# Patient Record
Sex: Male | Born: 1975 | Race: White | Hispanic: No | Marital: Married | Smoking: Former smoker
Health system: Southern US, Community
[De-identification: ages and names within clinical notes are randomized; demographics above are authoritative.]

## PROBLEM LIST (undated history)

## (undated) ENCOUNTER — Ambulatory Visit: Admission: EM | Payer: Commercial Managed Care - PPO

## (undated) DIAGNOSIS — K219 Gastro-esophageal reflux disease without esophagitis: Secondary | ICD-10-CM

## (undated) DIAGNOSIS — E785 Hyperlipidemia, unspecified: Secondary | ICD-10-CM

## (undated) HISTORY — DX: Gastro-esophageal reflux disease without esophagitis: K21.9

## (undated) HISTORY — DX: Hyperlipidemia, unspecified: E78.5

---

## 2000-08-26 ENCOUNTER — Other Ambulatory Visit: Admission: RE | Admit: 2000-08-26 | Discharge: 2000-08-26 | Payer: Self-pay | Admitting: Urology

## 2002-03-07 ENCOUNTER — Encounter: Admission: RE | Admit: 2002-03-07 | Discharge: 2002-03-07 | Payer: Self-pay | Admitting: Occupational Medicine

## 2002-03-07 ENCOUNTER — Encounter: Payer: Self-pay | Admitting: Occupational Medicine

## 2003-05-07 ENCOUNTER — Ambulatory Visit (HOSPITAL_COMMUNITY): Admission: RE | Admit: 2003-05-07 | Discharge: 2003-05-07 | Payer: Self-pay | Admitting: Family Medicine

## 2004-10-19 ENCOUNTER — Encounter: Admission: RE | Admit: 2004-10-19 | Discharge: 2004-10-19 | Payer: Self-pay | Admitting: Occupational Medicine

## 2008-05-13 ENCOUNTER — Ambulatory Visit (HOSPITAL_COMMUNITY): Admission: RE | Admit: 2008-05-13 | Discharge: 2008-05-13 | Payer: Self-pay | Admitting: Family Medicine

## 2009-05-15 ENCOUNTER — Ambulatory Visit (HOSPITAL_COMMUNITY): Admission: RE | Admit: 2009-05-15 | Discharge: 2009-05-15 | Payer: Self-pay | Admitting: Family Medicine

## 2009-12-09 HISTORY — PX: ESOPHAGOGASTRODUODENOSCOPY: SHX1529

## 2009-12-23 ENCOUNTER — Ambulatory Visit: Payer: Self-pay | Admitting: Internal Medicine

## 2009-12-23 DIAGNOSIS — R0789 Other chest pain: Secondary | ICD-10-CM | POA: Insufficient documentation

## 2009-12-29 ENCOUNTER — Ambulatory Visit: Payer: Self-pay | Admitting: Internal Medicine

## 2009-12-29 ENCOUNTER — Ambulatory Visit (HOSPITAL_COMMUNITY): Admission: RE | Admit: 2009-12-29 | Discharge: 2009-12-29 | Payer: Self-pay | Admitting: Internal Medicine

## 2009-12-31 ENCOUNTER — Encounter: Payer: Self-pay | Admitting: Internal Medicine

## 2010-01-27 ENCOUNTER — Ambulatory Visit: Payer: Self-pay | Admitting: Internal Medicine

## 2010-01-27 DIAGNOSIS — K219 Gastro-esophageal reflux disease without esophagitis: Secondary | ICD-10-CM

## 2010-02-23 ENCOUNTER — Encounter: Payer: Self-pay | Admitting: Internal Medicine

## 2010-03-01 ENCOUNTER — Encounter: Payer: Self-pay | Admitting: Family Medicine

## 2010-03-10 NOTE — Letter (Signed)
Summary: REFERRAL FROM [facility]  REFERRAL FROM [facility]   Imported By: Rexene Alberts 12/23/2009 15:23:58  _____________________________________________________________________  External Attachment:    Type:   Image     Comment:   External Document

## 2010-03-10 NOTE — Letter (Signed)
Summary: Patient Notice, Endo Biopsy Results  [facility]  [address]   [place], [place] [address]   Phone: [phone]  Fax: [phone]       December 31, 2009   Shawn Moon [address] [place], [place]  [address] 10/03/1975    Dear [name],  I am pleased to inform you that the biopsies taken during your recent endoscopic examination did not show any evidence of cancer or other abnormality upon pathologic examination.  Additional information/recommendations:  Continue with the treatment plan as outlined on the day of your exam.  Please call us if you are having persistent problems or have questions about your condition that have not been fully answered at this time.  Sincerely,    R. Roetta Sessions MD, FACP [facility] Ph: [phone]   Fax: [phone]   Appended Document: Patient Notice, Endo Biopsy Results letter mailed to pt

## 2010-03-12 NOTE — Assessment & Plan Note (Signed)
Summary: f/u EGD, GERD/MM   Visit Type:  Initial Consult Referring Nitin Mckowen:  [facility] Primary [facility] Vernel Langenderfer:  [facility]  CC:  Genella Rife.  History of Present Illness: Mr. Shawn Moon presents in f/u after EGD done December 29, 2009 secondary to globus sensation and pressure in anterior chest. EGD showed normal esophagus with couple of tiny erosions, started on trial of Protonix daily. Pt reports that pain has resolved, occasionally has reflux at night, works till 7, then eats around 7, in bed by nine. occurs 3 out of 5 nights. avoiding spicy foods.Denies N/V. No dysphagia/odynophagia. Feels better overall, but still having occasional reflux symptoms as he eats shortly before going to bed.   Current Medications (verified): 1)  Protonix 40 Mg Tbec (Pantoprazole Sodium) .... Take 1 Tablet By Mouth Once A Day  Allergies: 1)  ! Pcn 2)  ! Biaxin  Past History:  Past Surgical History: Last updated: 12/23/2009 Unremarkable  Past Medical History: Unremarkable 12/2009: EGD: Normal esophagus, status post softer biopsy. 2. Couple of tiny antral erosions of doubtful clinical significance,     otherwise normal stomach, D1 and D2.  Review of Systems General:  Denies fever, chills, and anorexia. Eyes:  Denies blurring, irritation, and discharge. ENT:  Denies sore throat, hoarseness, and difficulty swallowing. CV:  Denies chest pains and syncope. Resp:  Denies dyspnea at rest and wheezing. GI:  Denies difficulty swallowing, pain on swallowing, and nausea; occasional nocturnal reflux. GU:  Denies urinary burning, urinary frequency, and urinary hesitancy. MS:  Denies joint pain / LOM, joint swelling, and joint stiffness. Derm:  Denies rash, itching, and dry skin. Neuro:  Denies weakness and syncope. Psych:  Denies depression and anxiety. Endo:  Denies cold intolerance and heat intolerance. Heme:  Denies bruising and bleeding.  Vital Signs:  Patient profile:   35 year old male Height:       66 inches Weight:      192 pounds BMI:     31.10 Temp:     97.7 degrees F oral Pulse rate:   64 / minute BP sitting:   120 / 70  (left arm) Cuff size:   large  Vitals Entered By: Cloria Spring LPN (January 27, 2010 11:16 AM)  Physical Exam  General:  Well developed, well nourished, no acute distress. Lungs:  Clear throughout to auscultation. Heart:  Regular rate and rhythm; no murmurs, rubs,  or bruits. Abdomen:  normal bowel sounds, without guarding, without rebound, no distesion, no tenderness, no masses, and no hepatomegally or splenomegaly.   Msk:  Symmetrical with no gross deformities. Normal posture. Neurologic:  Alert and  oriented x4;  grossly normal neurologically. Psych:  Alert and cooperative. Normal mood and affect.  Impression & Recommendations:  Problem # 1:  GERD (ICD-46.47)  35 year old male with recent endoscopy reassuring, +reflux lessened with protonix daily. Likely denies pain. states has resolved. due to schedule, eats shortly before bed, likely culprit of occasional nocturnal reflux.   Continue Protonix daily Avoid eating late in evening Wt loss F/u in 6 weeks for reassessment.  Orders: Est. Patient Level II (62130) Prescriptions: PROTONIX 40 MG TBEC (PANTOPRAZOLE SODIUM) Take 1 tablet by mouth once a day  #30 x 3   Entered and Authorized by:   Gerrit Halls NP   Signed by:   Gerrit Halls NP on 01/28/2010   Method used:   Faxed to ...       [facility] (retail)       [address]       [place], [place]  [address]       Ph: [phone]       Fax: [phone]   RxID:   [id number]   Appended Document: f/u EGD, GERD/MM F/U OPV IN 6 MON IS IN THE COMPUTER

## 2010-03-12 NOTE — Assessment & Plan Note (Signed)
Summary: UPPER GI PROBLEMS/CHEST PAINS/LAW   Visit Type:  Initial Consult Primary Care Provider:  Dr Sherwood Gambler  Chief Complaint:  chest paiin and upper gi problems.  History of Present Illness: 35 y/o caucasian male here for chest pain evaluation.  c/o 6 mo hx of globus, pressure in anterior chest, "felt like someone squeezing my esophagus".  Tried lots of dexilant x 10 days and OTC PPIs and H2 blockers with no relief.  Z-pack seemed to help a couple months ago, but returned at the end of treatment.  Has had EKG normal per pt, Ba swallow 05/15/09-> ? mild duodinitis.  Denies dysphagia or odynophagia.  Pain lasts all day long.  Occ ASA or Aleve as needed 1-2 per month.  Denies N/V.  Denies constipation, diarrhea, rectal bleeding or melena.  Weight stable.  Denies anorexia.  Hx negative H pylori previously.  Current Problems (verified): 1)  Chest Pain, Atypical  (ICD-786.59)  Current Medications (verified): 1)  None  Allergies (verified): 1)  ! Pcn 2)  ! 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 10pkyr  Alcohol Use - no Illicit Drug Use - no Patient gets regular exercise. Smoking Status:  current Drug Use:  no Does Patient Exercise:  yes  Review of Systems      See HPI General:  Denies fever, chills, sweats, anorexia, fatigue, weakness, malaise, weight loss, and sleep disorder. CV:  Denies chest pains, angina, palpitations, syncope, dyspnea on exertion, orthopnea, PND, peripheral edema, and claudication. Resp:  Denies dyspnea at rest, dyspnea with exercise, cough, sputum, wheezing, coughing up blood, and pleurisy. GI:  Denies jaundice and fecal incontinence. GU:  Denies urinary burning, blood in urine, urinary  frequency, urinary hesitancy, nocturnal urination, and urinary incontinence. MS:  Complains of low back pain; denies joint pain / LOM, joint swelling, joint stiffness, joint deformity, muscle weakness, muscle cramps, muscle atrophy, leg pain at night, leg pain with exertion, and shoulder pain / LOM hand / wrist pain (CTS); occasionally goes to chiropractor for adjustments q 3 mo. Derm:  Denies rash, itching, dry skin, hives, moles, warts, and unhealing ulcers. Psych:  Denies depression, anxiety, memory loss, suicidal ideation, hallucinations, paranoia, phobia, and confusion. Heme:  Denies bruising, bleeding, and enlarged lymph nodes.  Vital Signs:  Patient profile:   35 year old male Height:      66 inches Weight:      188 pounds BMI:     30.45 Temp:     98.1 degrees F oral Pulse rate:   76 / minute BP sitting:   120 / 80  (left arm) Cuff size:   regular  Vitals Entered By: Hendricks Limes LPN (December 23, 2009 11:32 AM)  Physical Exam  General:  Well developed, well nourished, no acute distress. Head:  Normocephalic and atraumatic. Eyes:  Sclera clear, no icterus. Ears:  Normal auditory acuity. Nose:  No deformity, discharge,  or lesions. Mouth:  No deformity or lesions, dentition normal. Neck:  Supple; no masses or thyromegaly. Lungs:  Clear throughout to auscultation. Heart:  Regular rate and rhythm; no murmurs, rubs,  or bruits. Abdomen:  Soft, nontender and nondistended. No masses, hepatosplenomegaly or hernias noted. Normal bowel sounds.without guarding and without rebound.   Msk:  Symmetrical with  no gross deformities. Normal posture. Pulses:  Normal pulses noted. Extremities:  No clubbing, cyanosis, edema or deformities noted. Neurologic:  Alert and  oriented x4;  grossly normal neurologically. Skin:  Intact without significant lesions or rashes. Cervical Nodes:  No significant cervical adenopathy. Psych:  Alert and cooperative. Normal mood and affect.  Impression &  Recommendations:  Problem # 1:  CHEST PAIN, ATYPICAL (ICD-786.59) 35 y/o caucasian male w/ 6 month hx chronic chest pressure, unrelieved by PPI trials.  Also, c/o globus.  Negative cardiac evaluation per PCP. Differentials include GERD, esophagitis or eosinophilic esophagitis, esophageal spasm, or non-GI source.  EGD to be performed by Dr. Jonathon Bellows in the near future.  I have discussed risks and benefits which include, but are not limited to, bleeding, infection, perforation, or medication reaction.  The patient agrees with this plan and consent will be obtained.  Appended Document: Orders Update    Clinical Lists Changes  Orders: Added new Service order of Consultation Level III [phone]) - Signed

## 2010-03-12 NOTE — Letter (Signed)
Summary: [facility]  [facility]   Imported By: Ave Filter 02/23/2010 10:28:13  _____________________________________________________________________  External Attachment:    Type:   Image     Comment:   External Document

## 2010-03-13 NOTE — Letter (Signed)
Summary: EGD ORDER  EGD ORDER   Imported By: Ave Filter 12/23/2009 12:13:39  _____________________________________________________________________  External Attachment:    Type:   Image     Comment:   External Document

## 2010-03-25 ENCOUNTER — Encounter: Payer: Self-pay | Admitting: Gastroenterology

## 2010-03-25 ENCOUNTER — Ambulatory Visit (INDEPENDENT_AMBULATORY_CARE_PROVIDER_SITE_OTHER): Payer: 59 | Admitting: Gastroenterology

## 2010-03-25 DIAGNOSIS — K219 Gastro-esophageal reflux disease without esophagitis: Secondary | ICD-10-CM

## 2010-04-01 NOTE — Assessment & Plan Note (Signed)
Summary: return in 6 weeks/reflux/ss   Vital Signs:  Patient profile:   35 year old male Height:      66 inches Weight:      194.50 pounds BMI:     31.51 Temp:     97.9 degrees F oral Pulse rate:   60 / minute BP sitting:   122 / 72  (left arm) Cuff size:   large  Vitals Entered By: Cloria Spring LPN (March 25, 2010 10:39 AM)  Visit Type:  Follow-up Visit Primary Care Provider:  [facility]  CC:  F/U .  History of Present Illness: Pt presents today in follow-up for reflux. He states he is feeling significantly better. He is on Protonix daily. He states that if he eats late, he can feel some pressure if he lays on his belly. He denies pain. Now pt is on day shift, which has helped as well.  he is taking the protonix at night, actually, which seems to work for him. No dysphagia, no nausea. no complaints.  Current Medications (verified): 1)  Protonix 40 Mg Tbec (Pantoprazole Sodium) .... Take 1 Tablet By Mouth Once A Day  Allergies (verified): 1)  ! Pcn 2)  ! Biaxin  Past History:  Past Medical History: Last updated: 01/27/2010 Unremarkable 12/2009: EGD: Normal esophagus, status post softer biopsy. 2. Couple of tiny antral erosions of doubtful clinical significance,     otherwise normal stomach, D1 and D2.  Review of Systems General:  Denies fever, chills, and anorexia. Eyes:  Denies blurring, irritation, and discharge. ENT:  Denies sore throat, hoarseness, and difficulty swallowing. CV:  Denies chest pains and syncope. Resp:  Denies dyspnea at rest and wheezing. GI:  See HPI. GU:  Denies urinary burning and urinary frequency. MS:  Denies joint pain / LOM, joint swelling, and joint stiffness. Derm:  Denies rash, itching, and dry skin. Neuro:  Denies weakness and syncope. Psych:  Denies depression and anxiety. Endo:  Denies cold intolerance and heat intolerance.  Physical Exam  General:  Well developed, well nourished, no acute distress. Eyes:  PERRLA, no  icterus. Mouth:  No deformity or lesions, dentition normal. Abdomen:  +BS, soft, non-tender, non-distended, no HSM, no rebound or guarding.  Msk:  Symmetrical with no gross deformities. Normal posture. Neurologic:  Alert and  oriented x4;  grossly normal neurologically.   Impression & Recommendations:  Problem # 1:  GERD (ICD-81.65)  35 year old pleasant Caucasian male with hx of GERD, s/p EGD in Nov 2011. Doing well on once daily Protonix. No dysphagia/odynophagia.   Continue Protonix F/U in 1 year or sooner as needed  Orders: Est. Patient Level II (81191) Prescriptions: PROTONIX 40 MG TBEC (PANTOPRAZOLE SODIUM) Take 1 tablet by mouth once a day  #30 x 11   Entered and Authorized by:   Gerrit Halls NP   Signed by:   Gerrit Halls NP on 03/25/2010   Method used:   Faxed to ...       [facility] (retail)       [address]       [place], [place]  [address]       Ph: [phone]       Fax: [phone]   RxID:   [id number]    Orders Added: 1)  Est. Patient Level II [44034]  Appended Document: return in 6 weeks/reflux/ss 1 YR F/U OPV IS IN THE COMPUTER

## 2010-05-07 ENCOUNTER — Other Ambulatory Visit (HOSPITAL_COMMUNITY): Payer: Self-pay | Admitting: Urology

## 2010-05-07 DIAGNOSIS — N5089 Other specified disorders of the male genital organs: Secondary | ICD-10-CM

## 2010-05-12 ENCOUNTER — Other Ambulatory Visit (HOSPITAL_COMMUNITY): Payer: Self-pay | Admitting: Urology

## 2010-05-12 ENCOUNTER — Ambulatory Visit (HOSPITAL_COMMUNITY)
Admission: RE | Admit: 2010-05-12 | Discharge: 2010-05-12 | Disposition: A | Discharge status: Home / Self Care | Payer: 59 | Source: Ambulatory Visit | Attending: Urology | Admitting: Urology

## 2010-05-12 DIAGNOSIS — N5089 Other specified disorders of the male genital organs: Secondary | ICD-10-CM

## 2010-05-12 DIAGNOSIS — N508 Other specified disorders of male genital organs: Secondary | ICD-10-CM | POA: Insufficient documentation

## 2010-07-15 ENCOUNTER — Encounter: Payer: Self-pay | Admitting: Gastroenterology

## 2010-07-15 ENCOUNTER — Ambulatory Visit (INDEPENDENT_AMBULATORY_CARE_PROVIDER_SITE_OTHER): Payer: 59 | Admitting: Gastroenterology

## 2010-07-15 VITALS — BP 121/73 | HR 80 | Temp 98.3°F | Ht 66.0 in | Wt 197.4 lb

## 2010-07-15 DIAGNOSIS — R079 Chest pain, unspecified: Secondary | ICD-10-CM

## 2010-07-15 DIAGNOSIS — R0789 Other chest pain: Secondary | ICD-10-CM

## 2010-07-15 DIAGNOSIS — K219 Gastro-esophageal reflux disease without esophagitis: Secondary | ICD-10-CM

## 2010-07-15 MED ORDER — PANTOPRAZOLE SODIUM 40 MG PO TBEC: 40.0000 mg | DELAYED_RELEASE_TABLET | Freq: Two times a day (BID) | ORAL | Status: DC

## 2010-07-15 NOTE — Progress Notes (Signed)
Referring Provider: Dr. Phillips Odor  Primary Care Physician:  Colette Ribas, MD Primary Gasteroenterologist: Dr. Jena Gauss   Chief Complaint  Patient presents with  . Probiotic not working    HPI:   Shawn Moon presents today in f/u for GERD. He underwent an EGD in Nov 2011 secondary to chest pain, globus sensation, reflux. He was started on Protonix. I actually saw him in December 2011, and he was doing fairly well. We discussed weight loss and appropriate eating habits. He returns today with concerns regarding  retrosternal pain X 2 weeks. If he eats, the pain goes away, then returns 45 minutes later. Denies nausea or dysphagia. Denies any typical reflux symptoms. No NSAIDS or aspirin powders. No change in diet. Has tried Dexilant in past with no improvement. Denies nocturnal reflux. At times, he feels better when he lays down. Took double dosing Protonix a few days ago but did not notice any improvement. He denies epigastric pain. Absolutely no abdominal pain.  Denies melena or brbpr. Again, symptoms are retrosternal solely.   Past Medical History  Diagnosis Date  . GERD (gastroesophageal reflux disease)     Past Surgical History  Procedure Date  . Esophagogastroduodenoscopy 11/11    Normal esophagus,status post softer biopsy; Couple of tiny antral erosions of doubtful clinical significance otherwise normal stomach    Current Outpatient Prescriptions  Medication Sig Dispense Refill  . loratadine (CLARITIN) 10 MG tablet Take 10 mg by mouth daily.        . pantoprazole (PROTONIX) 40 MG tablet Take 1 tablet (40 mg total) by mouth 2 (two) times daily. 30 minutes before breakfast and 30 minutes before supper  60 tablet  1    Allergies as of 07/15/2010 - Review Complete 07/15/2010  Allergen Reaction Noted  . Clarithromycin    . Penicillins      Family History  Problem Relation Age of Onset  . Colon cancer Paternal Grandmother     History   Social History  . Marital Status:  Married    Spouse Name: N/A    Number of Children: N/A  . Years of Education: N/A   Social History Main Topics  . Smoking status: Never Smoker   . Smokeless tobacco: None  . Alcohol Use: No  . Drug Use: No  . Sexually Active: None     Review of Systems: Gen: Denies fever, chills, anorexia. Denies fatigue, weakness, weight loss.  CV: Denies chest pain, palpitations, syncope, peripheral edema, and claudication. Resp: Denies dyspnea at rest, cough, wheezing, coughing up blood, and pleurisy. GI: Denies vomiting blood, jaundice, and fecal incontinence.   Denies dysphagia or odynophagia. Derm: Denies rash, itching, dry skin Psych: Denies depression, anxiety, memory loss, confusion. No homicidal or suicidal ideation.  Heme: Denies bruising, bleeding, and enlarged lymph nodes.  Physical Exam: BP 121/73  Pulse 80  Temp(Src) 98.3 F (36.8 C) (Temporal)  Ht 5\' 6"  (1.676 m)  Wt 197 lb 6.4 oz (89.54 kg)  BMI 31.86 kg/m2 General:   Alert and oriented. No distress noted. Pleasant and cooperative.  Head:  Normocephalic and atraumatic. Eyes:  Conjuctiva clear without scleral icterus. Mouth:  Oral mucosa pink and moist. Good dentition. No lesions. Neck:  Supple, without mass or thyromegaly. Heart:  S1, S2 present without murmurs, rubs, or gallops. Regular rate and rhythm. Abdomen:  +BS, soft, non-tender and non-distended. No rebound or guarding. No HSM or masses noted. Msk:  Symmetrical without gross deformities. Normal posture. Extremities:  Without edema. Neurologic:  Alert  and  oriented x4;  grossly normal neurologically. Skin:  Intact without significant lesions or rashes. Cervical Nodes:  No significant cervical adenopathy. Psych:  Alert and cooperative. Normal mood and affect.

## 2010-07-15 NOTE — Patient Instructions (Signed)
Increase Protonix to 40 mg twice a day: 30 minutes before breakfast and 30 minutes before dinner.   Follow a low-fat diet.  Contact us in 5-7 days. If no improvement, we will proceed with a chest CT.  Please have baseline bloodwork done today. We will call you with results.

## 2010-07-16 LAB — CBC WITH DIFFERENTIAL/PLATELET
Basophils Absolute: 0 10*3/uL (ref 0.0–0.1)
Basophils Relative: 0 % (ref 0–1)
HCT: 42.5 % (ref 39.0–52.0)
Lymphocytes Relative: 23 % (ref 12–46)
MCHC: 33.9 g/dL (ref 30.0–36.0)
Monocytes Absolute: 0.4 10*3/uL (ref 0.1–1.0)
Neutro Abs: 3.4 10*3/uL (ref 1.7–7.7)
Neutrophils Relative %: 65 % (ref 43–77)
Platelets: 243 10*3/uL (ref 150–400)
RDW: 12.8 % (ref 11.5–15.5)
WBC: 5.2 10*3/uL (ref 4.0–10.5)

## 2010-07-17 ENCOUNTER — Encounter: Payer: Self-pay | Admitting: Gastroenterology

## 2010-07-17 NOTE — Assessment & Plan Note (Signed)
35 year old male with symptoms of retrosternal chest discomfort, relieved by food, X 2 weeks. Had been doing well on Protonix until recently. EGD Nov 2011. Absolutely no abdominal pain, N/V. As of note, in early 2011 had CXR, UGI. No chest CT has been performed. At this point, will increase Protonix to BID for approximately 5-7 days. If no improvement, will proceed with CT chest to r/o other etiology. Assess baseline CBC, as pt has not had any labs done recently. Although his symptoms are not typical at all for biliary component, will need to keep other options in back of mind. Pt may benefit from pH study in future. Will proceed with double dosing Protonix first, then reassess.

## 2010-07-20 NOTE — Progress Notes (Signed)
Cc to PCP 

## 2010-07-29 ENCOUNTER — Telehealth: Payer: Self-pay

## 2010-07-29 NOTE — Telephone Encounter (Signed)
Pt called back to give a PR- he is not doing any better on bid PPI and would like to schedule CT that AS recommended.

## 2010-07-30 ENCOUNTER — Encounter: Payer: Self-pay | Admitting: Internal Medicine

## 2010-07-30 ENCOUNTER — Other Ambulatory Visit: Payer: Self-pay | Admitting: Gastroenterology

## 2010-07-30 DIAGNOSIS — R0789 Other chest pain: Secondary | ICD-10-CM

## 2010-08-04 NOTE — Telephone Encounter (Signed)
Pt scheduled for ct scan 08/06/10@9 :00am.  lmom for pt.

## 2010-08-06 ENCOUNTER — Ambulatory Visit (HOSPITAL_COMMUNITY): Admission: RE | Admit: 2010-08-06 | Payer: 59 | Source: Ambulatory Visit

## 2011-03-17 ENCOUNTER — Encounter: Payer: Self-pay | Admitting: Internal Medicine

## 2012-07-07 ENCOUNTER — Encounter (HOSPITAL_COMMUNITY): Payer: Self-pay | Admitting: *Deleted

## 2012-07-07 ENCOUNTER — Emergency Department (HOSPITAL_COMMUNITY)
Admission: EM | Admit: 2012-07-07 | Discharge: 2012-07-08 | Disposition: A | Discharge status: Home / Self Care | Payer: BC Managed Care – PPO | Attending: Emergency Medicine | Admitting: Emergency Medicine

## 2012-07-07 DIAGNOSIS — Y9389 Activity, other specified: Secondary | ICD-10-CM | POA: Insufficient documentation

## 2012-07-07 DIAGNOSIS — Z79899 Other long term (current) drug therapy: Secondary | ICD-10-CM | POA: Insufficient documentation

## 2012-07-07 DIAGNOSIS — Y9289 Other specified places as the place of occurrence of the external cause: Secondary | ICD-10-CM | POA: Insufficient documentation

## 2012-07-07 DIAGNOSIS — H9209 Otalgia, unspecified ear: Secondary | ICD-10-CM | POA: Insufficient documentation

## 2012-07-07 DIAGNOSIS — K219 Gastro-esophageal reflux disease without esophagitis: Secondary | ICD-10-CM | POA: Insufficient documentation

## 2012-07-07 DIAGNOSIS — T161XXA Foreign body in right ear, initial encounter: Secondary | ICD-10-CM

## 2012-07-07 DIAGNOSIS — Z880 Allergy status to penicillin: Secondary | ICD-10-CM | POA: Insufficient documentation

## 2012-07-07 DIAGNOSIS — T169XXA Foreign body in ear, unspecified ear, initial encounter: Secondary | ICD-10-CM | POA: Insufficient documentation

## 2012-07-07 DIAGNOSIS — IMO0002 Reserved for concepts with insufficient information to code with codable children: Secondary | ICD-10-CM | POA: Insufficient documentation

## 2012-07-07 NOTE — ED Provider Notes (Signed)
History     CSN: [id number]  Arrival date & time 07/07/12  2341   First MD Initiated Contact with Patient 07/07/12 2344      Chief Complaint  Patient presents with  . Foreign Body in Ear    (Consider location/radiation/quality/duration/timing/severity/associated sxs/prior treatment) HPI Shawn Moon is a 37 y.o. male who presents to the ED with possible insect in his ear. He thinks a bug flew in his ear about 20 minutes prior to arrival to the ED. The history was provided by the patient.  Past Medical History  Diagnosis Date  . GERD (gastroesophageal reflux disease)     Past Surgical History  Procedure Laterality Date  . Esophagogastroduodenoscopy  11/11    Normal esophagus,status post softer biopsy; Couple of tiny antral erosions of doubtful clinical significance otherwise normal stomach    Family History  Problem Relation Age of Onset  . Colon cancer Paternal Grandmother     History  Substance Use Topics  . Smoking status: Never Smoker   . Smokeless tobacco: Not on file  . Alcohol Use: No      Review of Systems  Constitutional: Negative for fever.  HENT: Positive for ear pain.   Gastrointestinal: Negative for nausea and vomiting.  Skin: Negative for rash.  Neurological: Negative for headaches.  Psychiatric/Behavioral: The patient is not nervous/anxious.     Allergies  Clarithromycin and Penicillins  Home Medications   Current Outpatient Rx  Name  Route  Sig  Dispense  Refill  . loratadine (CLARITIN) 10 MG tablet   Oral   Take 10 mg by mouth daily.           . pantoprazole (PROTONIX) 40 MG tablet   Oral   Take 1 tablet (40 mg total) by mouth 2 (two) times daily. 30 minutes before breakfast and 30 minutes before supper   60 tablet   1     There were no vitals taken for this visit.  Physical Exam  Nursing note and vitals reviewed. Constitutional: He is oriented to person, place, and time. He appears well-developed and well-nourished. No  distress.  HENT:  Head: Normocephalic.  Right Ear: A foreign body is present.  Left Ear: Tympanic membrane normal.  Eyes: EOM are normal.  Neck: Neck supple.  Pulmonary/Chest: Effort normal.  Musculoskeletal: Normal range of motion.  Neurological: He is alert and oriented to person, place, and time. No cranial nerve deficit.  Skin: Skin is warm and dry.  Psychiatric: He has a normal mood and affect.    ED Course  Procedures (including critical care time) I applied lidocaine 2% to ear canal then irrigated with warm water. Bug removed from right ear canal without difficulty.   MDM  37 y.o. male with foreign body to right ear. Removed without difficulty. After removed TM visualized and is normal. Ear canal with irritation. Patient stable for discharge home. Will use Cortisporin Otic in the right ear for the next few days.  Discussed plan of care with the patient and all questioned fully answered. He will return if any problems arise.         [address] [place], [place] 07/08/12 [phone]

## 2012-07-07 NOTE — ED Notes (Signed)
Pt states a bug flew in ear about 15 to 20 minutes ago

## 2012-07-08 MED ORDER — LIDOCAINE HCL (PF) 2 % IJ SOLN
INTRAMUSCULAR | Status: AC
  Filled 2012-07-08: qty 10

## 2012-07-08 MED ORDER — NEOMYCIN-POLYMYXIN-HC 3.5-10000-1 OT SOLN
3.0000 [drp] | Freq: Four times a day (QID) | OTIC | Status: DC
  Administered 2012-07-08: 3 [drp] via OTIC
  Filled 2012-07-08: qty 10

## 2012-07-08 NOTE — ED Provider Notes (Signed)
Medical screening examination/treatment/procedure(s) were performed by non-physician practitioner and as supervising physician I was immediately available for consultation/collaboration.  FB removal  Sunnie Nielsen, MD 07/08/12 [phone]

## 2013-08-08 ENCOUNTER — Other Ambulatory Visit (HOSPITAL_COMMUNITY): Payer: Self-pay | Admitting: Internal Medicine

## 2013-08-08 DIAGNOSIS — R51 Headache: Secondary | ICD-10-CM

## 2013-08-08 DIAGNOSIS — M272 Inflammatory conditions of jaws: Secondary | ICD-10-CM

## 2013-08-08 DIAGNOSIS — J329 Chronic sinusitis, unspecified: Secondary | ICD-10-CM

## 2013-08-13 ENCOUNTER — Ambulatory Visit (HOSPITAL_COMMUNITY)
Admission: RE | Admit: 2013-08-13 | Discharge: 2013-08-13 | Disposition: A | Discharge status: Home / Self Care | Payer: BC Managed Care – PPO | Source: Ambulatory Visit | Attending: Internal Medicine | Admitting: Internal Medicine

## 2013-08-13 DIAGNOSIS — M272 Inflammatory conditions of jaws: Secondary | ICD-10-CM

## 2013-08-13 DIAGNOSIS — J329 Chronic sinusitis, unspecified: Secondary | ICD-10-CM | POA: Insufficient documentation

## 2013-08-13 DIAGNOSIS — R51 Headache: Secondary | ICD-10-CM | POA: Insufficient documentation

## 2013-08-13 DIAGNOSIS — J342 Deviated nasal septum: Secondary | ICD-10-CM | POA: Insufficient documentation

## 2013-09-06 ENCOUNTER — Ambulatory Visit (INDEPENDENT_AMBULATORY_CARE_PROVIDER_SITE_OTHER): Payer: BC Managed Care – PPO | Admitting: Otolaryngology

## 2013-09-06 DIAGNOSIS — H903 Sensorineural hearing loss, bilateral: Secondary | ICD-10-CM

## 2013-09-06 DIAGNOSIS — H6980 Other specified disorders of Eustachian tube, unspecified ear: Secondary | ICD-10-CM

## 2013-09-06 DIAGNOSIS — J0100 Acute maxillary sinusitis, unspecified: Secondary | ICD-10-CM

## 2013-09-27 ENCOUNTER — Ambulatory Visit (INDEPENDENT_AMBULATORY_CARE_PROVIDER_SITE_OTHER): Payer: BC Managed Care – PPO | Admitting: Otolaryngology

## 2013-09-27 DIAGNOSIS — H6980 Other specified disorders of Eustachian tube, unspecified ear: Secondary | ICD-10-CM

## 2015-05-17 IMAGING — CT CT MAXILLOFACIAL W/O CM
3 series · 14 of 47 positions shown, 16 images · non-contrast
Comparison: None.

CLINICAL DATA: Headache.  Sinusitis.

EXAM:
CT MAXILLOFACIAL WITHOUT CONTRAST
TECHNIQUE: Multidetector CT imaging of the maxillofacial structures was
performed. Multiplanar CT image reconstructions were also generated.
A small metallic BB was placed on the right temple in order to
reliably differentiate right from left.

[Series 3: sinus st axial 2.0 h37s · axial · 0.33mm/px · z∈[+66,+160]mm · 8 of 55 slices shown, 10 images]
[im 4/55  brain]
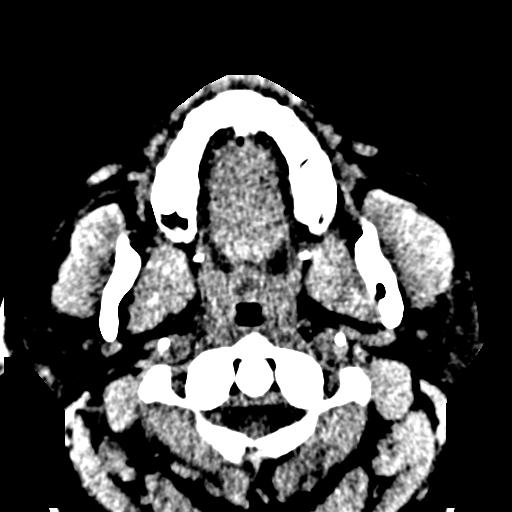
[im 4/55  bone]
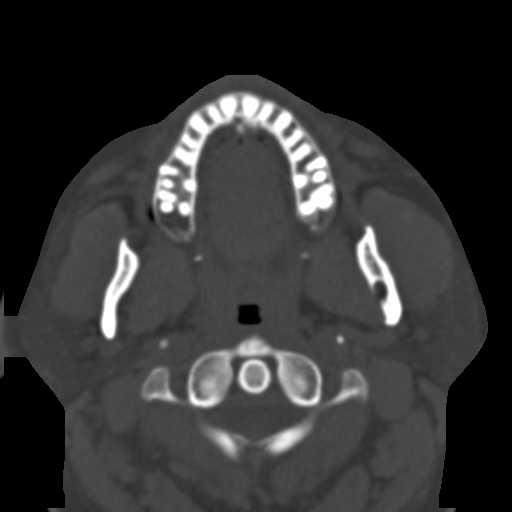
[im 12/55  bone]
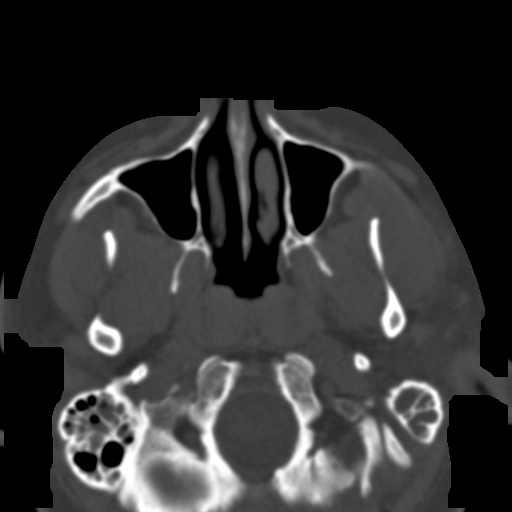
[im 17/55  bone]
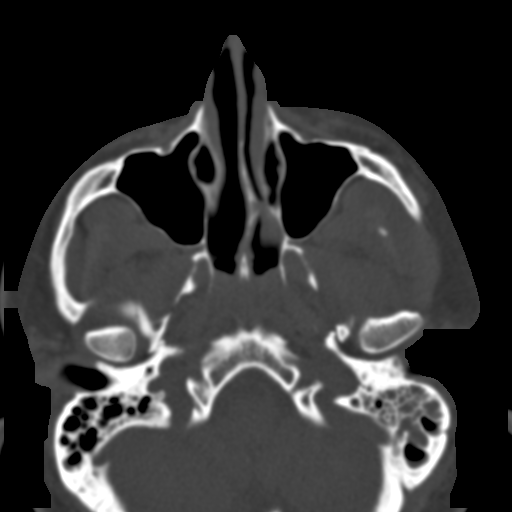
[im 25/55  bone]
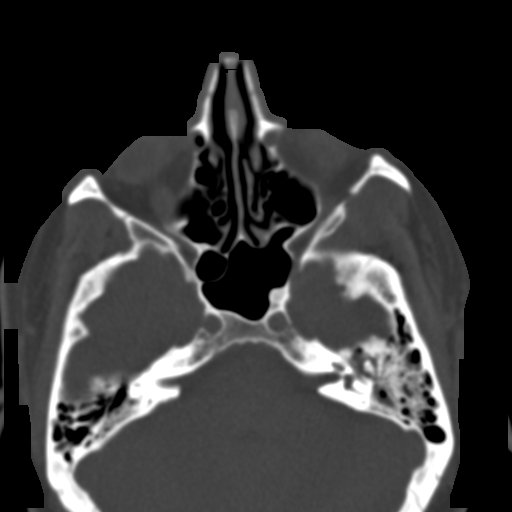
[im 30/55  brain]
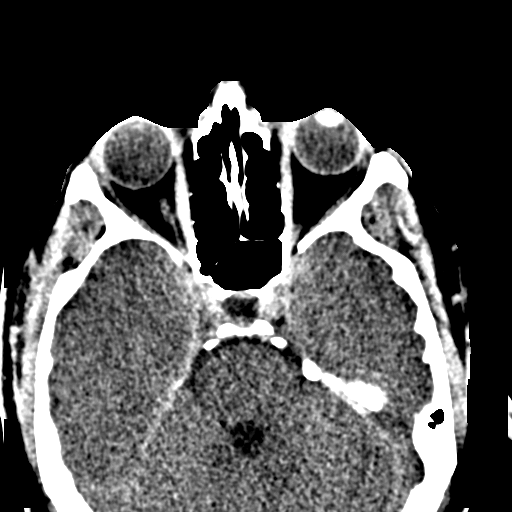
[im 30/55  bone]
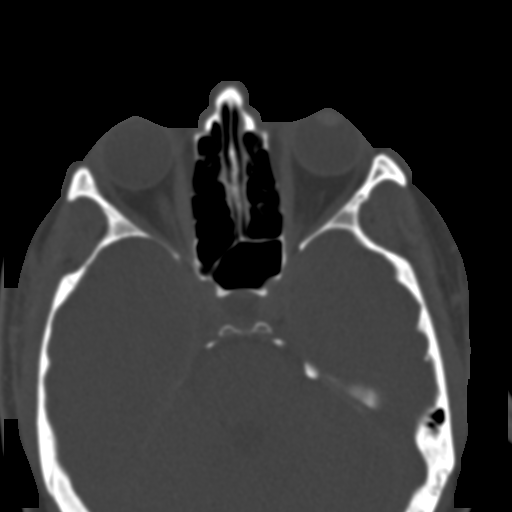
[im 38/55  bone]
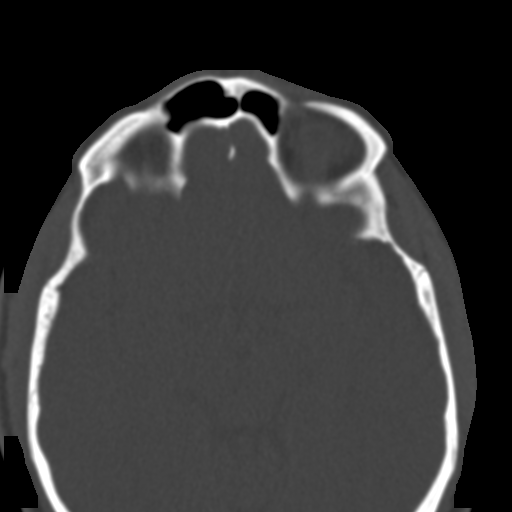
[im 43/55  bone]
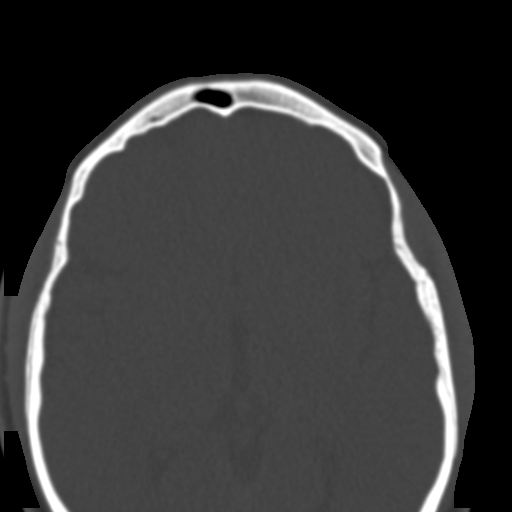
[im 51/55  bone]
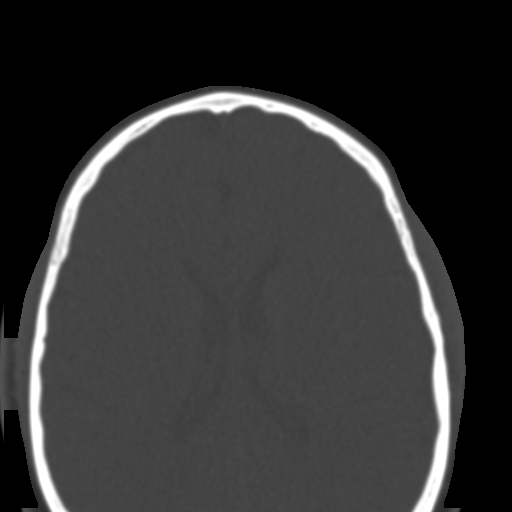

[Series 4: sinus st coro 2.0 spo · coronal · 0.23mm/px · 3 of 80 slices shown]
[im 27/80  bone]
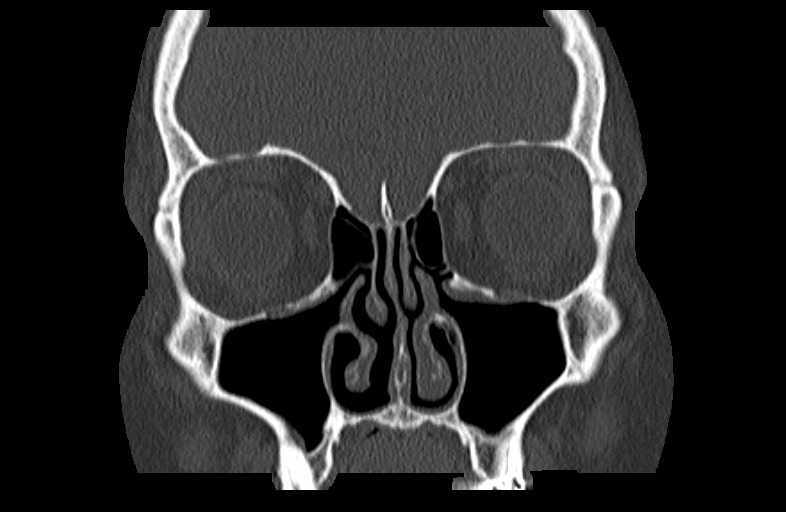
[im 36/80  bone]
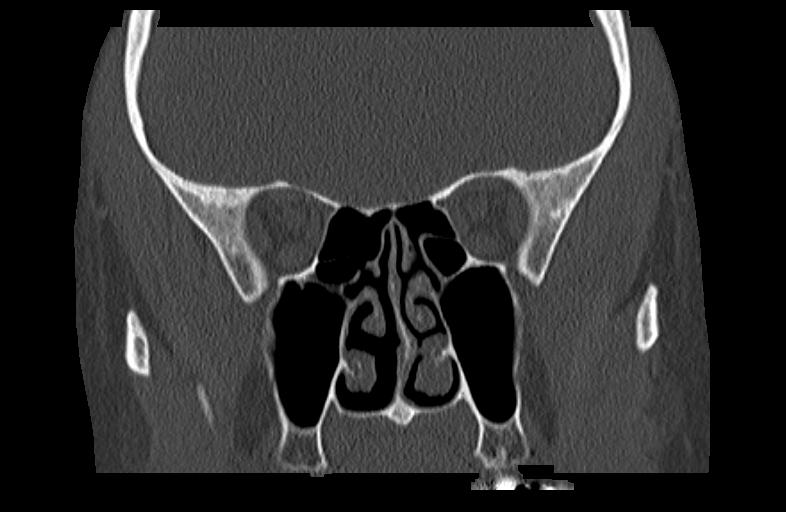
[im 44/80  bone]
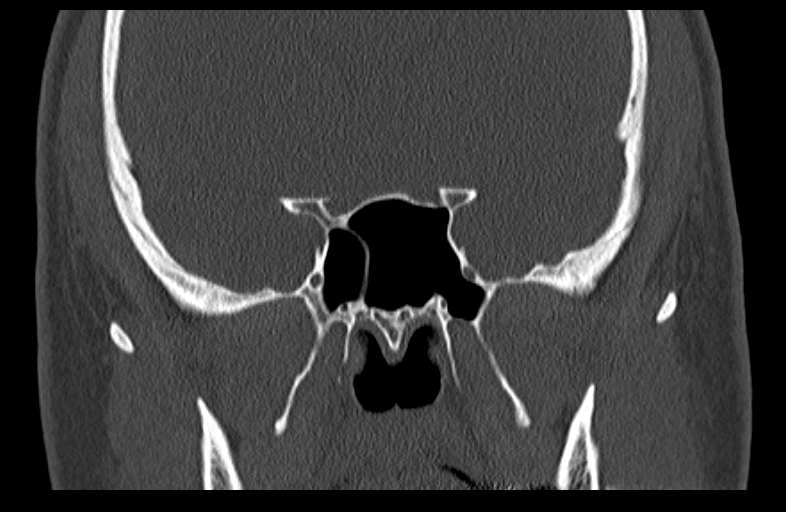

[Series 5: sinus st sag 2.0 spo · sagittal · 0.22mm/px · 3 of 81 slices shown]
[im 27/81  bone]
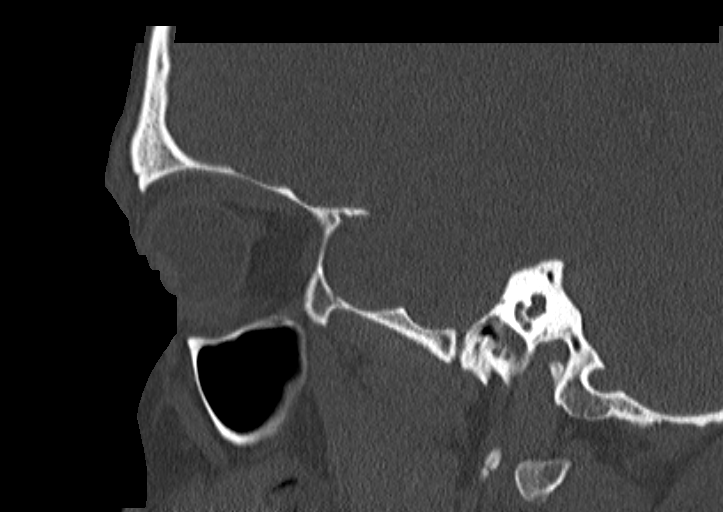
[im 41/81  bone]
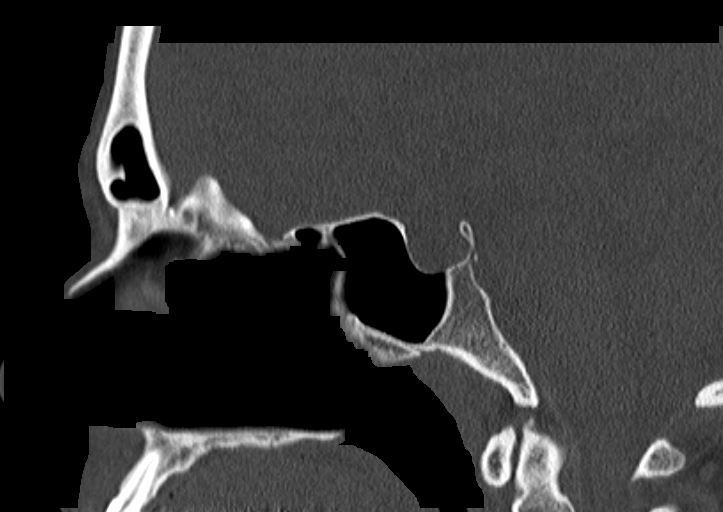
[im 54/81  bone]
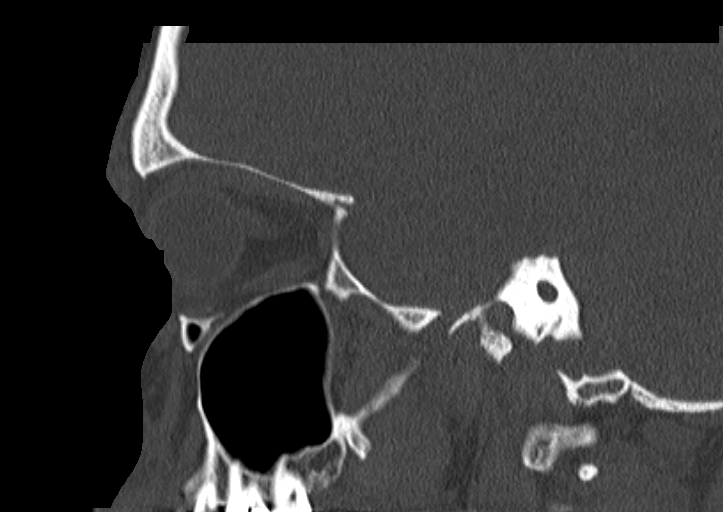

[14 of 47 positions shown; findings below may reference images not displayed]

FINDINGS: The maxillary, frontal, ethmoid, and sphenoid sinuses are clear.
There is very slight nasal septal deviation right-to-left of 2 mm.
Mild infundibular narrowing on the left related to mucosal
thickening. No concha bullosa. Negative orbits. Negative
intracranial compartment. Probable empty sella. Bilateral
nasopharyngeal adenoidal hypertrophy. Bilateral mastoid fluid,
likely effusions. Left middle ear fluid. No osseous destruction.
IMPRESSION: No significant sinusitis.

Mild nasal septal deviation right to left 2 mm.

Bilateral mastoid fluid and left middle ear fluid.

## 2016-04-13 ENCOUNTER — Ambulatory Visit (INDEPENDENT_AMBULATORY_CARE_PROVIDER_SITE_OTHER): Payer: BLUE CROSS/BLUE SHIELD | Admitting: Family Medicine

## 2016-04-13 ENCOUNTER — Encounter: Payer: Self-pay | Admitting: Family Medicine

## 2016-04-13 VITALS — BP 122/82 | Ht 66.0 in | Wt 165.6 lb

## 2016-04-13 DIAGNOSIS — Z0000 Encounter for general adult medical examination without abnormal findings: Secondary | ICD-10-CM | POA: Diagnosis not present

## 2016-04-13 NOTE — Progress Notes (Signed)
   Subjective:    Patient ID: Shawn Moon, male    DOB: 29-Jun-1975, 41 y.o.   MRN: [id number]  HPI The patient comes in today for a wellness visit.    A review of their health history was completed.  A review of medications was also completed.  Any needed refills; no  Eating habits: trying to eat good  Falls/  MVA accidents in past few months: none  Regular exercise: not really  Specialist pt sees on regular basis: none  Preventative health issues were discussed.   Additional concerns: discuss need for colonoscopy-previous doctor recommended due to family history of colon cancer   pt told all his blood work excellent  Trying to eat better now, two meals per day  Does a lot of walking  Pt is sherrif and supervises the jail  pts g mo had colon cancer on father's side  Last may pt's fa had prcancerous polyps and tub adenoma    t stays active on days of, recently moved and staying engaged   Pt offered flu shots, gonna get reg  Pt had flu this yr,    Review of Systems  Constitutional: Negative for activity change, appetite change and fever.  HENT: Negative for congestion and rhinorrhea.   Eyes: Negative for discharge.  Respiratory: Negative for cough and wheezing.   Cardiovascular: Negative for chest pain.  Gastrointestinal: Negative for abdominal pain, blood in stool and vomiting.  Genitourinary: Negative for difficulty urinating and frequency.  Musculoskeletal: Negative for neck pain.  Skin: Negative for rash.  Allergic/Immunologic: Negative for environmental allergies and food allergies.  Neurological: Negative for weakness and headaches.  Psychiatric/Behavioral: Negative for agitation.  All other systems reviewed and are negative.      Objective:   Physical Exam  Constitutional: He appears well-developed and well-nourished.  HENT:  Head: Normocephalic and atraumatic.  Right Ear: External ear normal.  Left Ear: External ear normal.  Nose: Nose  normal.  Mouth/Throat: Oropharynx is clear and moist.  Eyes: EOM are normal. Pupils are equal, round, and reactive to light.  Neck: Normal range of motion. Neck supple. No thyromegaly present.  Cardiovascular: Normal rate, regular rhythm and normal heart sounds.   No murmur heard. Pulmonary/Chest: Effort normal and breath sounds normal. No respiratory distress. He has no wheezes.  Abdominal: Soft. Bowel sounds are normal. He exhibits no distension and no mass. There is no tenderness.  Genitourinary: Penis normal.  Genitourinary Comments: Prostate exam within normal limits  Musculoskeletal: Normal range of motion. He exhibits no edema.  Lymphadenopathy:    He has no cervical adenopathy.  Neurological: He is alert. He exhibits normal muscle tone.  Skin: Skin is warm and dry. No erythema.  Psychiatric: He has a normal mood and affect. His behavior is normal. Judgment normal.  Vitals reviewed.         Assessment & Plan:  Impression 1 well adult exam patient has history of grandmother with colon cancer and father were tubular adenomas. He is 41 years old. Wonders if he needs colonoscopy yet. I told him we would research this. Plan old records. Encouraged to get yearly flu shots. Diet exercise discussed. [place] [facility]

## 2016-10-01 ENCOUNTER — Telehealth: Payer: Self-pay | Admitting: Family Medicine

## 2016-10-01 DIAGNOSIS — Z800 Family history of malignant neoplasm of digestive organs: Secondary | ICD-10-CM

## 2016-10-01 NOTE — Telephone Encounter (Signed)
Pt states that the last time he was seen Dr. Brett Canales was going to look in to a screening colonoscopy  States that Dr. Brett Canales told him that he may need to be screened earlier than 41 years old due to his strong family history. No referral in chart. Insurance is now [facility] through the county  Please advise

## 2016-10-03 NOTE — Telephone Encounter (Signed)
Let pt know this rec is somewhat controversial because tho cancer in g mo only hx of polyps in father, go ahead and do g I ref for possible colonoscopy. Let him know if he can take a copy of his father's biopsy results that will help the gi doc make a decision whether to do one or not

## 2016-10-04 NOTE — Telephone Encounter (Signed)
Referral ordered in EPIC. Patient notified. 

## 2016-10-05 ENCOUNTER — Encounter: Payer: Self-pay | Admitting: Family Medicine

## 2016-10-19 ENCOUNTER — Encounter (INDEPENDENT_AMBULATORY_CARE_PROVIDER_SITE_OTHER): Payer: Self-pay | Admitting: *Deleted

## 2017-01-04 ENCOUNTER — Ambulatory Visit (INDEPENDENT_AMBULATORY_CARE_PROVIDER_SITE_OTHER): Payer: Commercial Managed Care - PPO | Admitting: Orthopaedic Surgery

## 2017-01-04 ENCOUNTER — Encounter: Payer: Self-pay | Admitting: Orthopaedic Surgery

## 2017-01-04 VITALS — BP 118/77 | HR 81 | Temp 97.1°F | Ht 67.0 in | Wt 176.0 lb

## 2017-01-04 DIAGNOSIS — M7061 Trochanteric bursitis, right hip: Secondary | ICD-10-CM

## 2017-01-04 NOTE — Progress Notes (Signed)
Subjective:    Patient ID: Shawn ObeyJoseph E Nardone, male    DOB: [birth year], 41 y.o.   MRN: [id number]  HPI He fell off his pickup truck unloading a lawnmower in August of this year.  He hurt his right hip.  He has had pain on the lateral right hip since then.  He has pain after getting up from sitting a while.  He is in [employer].  [place] [facility]  He has been on ibuprofen 600 tid.  He has used ice.   All with no help.  He is tired of hurting.  He is able to do his job.  But he just has pain.  He has no numbness, no loss of rotation of the right hip.     Review of Systems  HENT: Negative for congestion.   Respiratory: Negative for cough and shortness of breath.   Cardiovascular: Negative for chest pain and leg swelling.  Endocrine: Negative for cold intolerance.  Musculoskeletal: Negative for arthralgias and gait problem.  Allergic/Immunologic: Negative for environmental allergies.   Past Medical History:  Diagnosis Date  . GERD (gastroesophageal reflux disease)     Past Surgical History:  Procedure Laterality Date  . ESOPHAGOGASTRODUODENOSCOPY  11/11   Normal esophagus,status post softer biopsy; Couple of tiny antral erosions of doubtful clinical significance otherwise normal stomach    Current Outpatient Medications on File Prior to Visit  Medication Sig Dispense Refill  . ibuprofen (ADVIL,MOTRIN) 400 MG tablet Take 400 mg by mouth every 6 (six) hours as needed for pain.    Marland Kitchen. loratadine (CLARITIN) 10 MG tablet Take 10 mg by mouth daily.      . Melatonin 5 MG CAPS Take 5 mg by mouth as needed.     No current facility-administered medications on file prior to visit.     Social History   Socioeconomic History  . Marital status: Married    Spouse name: Not on file  . Number of children: Not on file  . Years of education: Not on file  . Highest education level: Not on file  Social Needs  . Financial resource strain: Not on file  . Food  insecurity - worry: Not on file  . Food insecurity - inability: Not on file  . Transportation needs - medical: Not on file  . Transportation needs - non-medical: Not on file  Occupational History  . Not on file  Tobacco Use  . Smoking status: Former Games developermoker  . Smokeless tobacco: Never Used  Substance and Sexual Activity  . Alcohol use: No  . Drug use: No  . Sexual activity: Not on file  Other Topics Concern  . Not on file  Social History Narrative  . Not on file    Family History  Problem Relation Age of Onset  . Colon cancer Paternal Grandmother   . Diabetes Paternal Grandmother   . Hypertension Father   . Diabetes Maternal Grandmother     BP 118/77   Pulse 81   Temp (!) 97.1 F (36.2 C)   Ht 5\' 7"  (1.702 m)   Wt 176 lb (79.8 kg)   BMI 27.57 kg/m      Objective:   Physical Exam  Constitutional: He is oriented to person, place, and time. He appears well-developed and well-nourished.  HENT:  Head: Normocephalic and atraumatic.  Eyes: Conjunctivae and EOM are normal. Pupils are equal, round, and reactive to light.  Neck: Normal range of motion. Neck  supple.  Cardiovascular: Normal rate, regular rhythm and intact distal pulses.  Pulmonary/Chest: Effort normal.  Abdominal: Soft.  Musculoskeletal: He exhibits tenderness (Right lateral hip is tender over the trochanteric area with no redness or swelling.  ROM of the right  hip and left hip is normal.  Gait normal.  NV intact.).  Neurological: He is alert and oriented to person, place, and time. He has normal reflexes. He displays normal reflexes. No cranial nerve deficit. He exhibits normal muscle tone. Coordination normal.  Skin: Skin is warm and dry.  Psychiatric: He has a normal mood and affect. His behavior is normal. Judgment and thought content normal.  Vitals reviewed.         Assessment & Plan:   Encounter Diagnosis  Name Primary?  . Trochanteric bursitis, right hip Yes   PROCEDURE NOTE:  The patient  request injection, verbal consent was obtained.  The right trochanteric area of the hip was prepped appropriately after time out was performed.   Sterile technique was observed and injection of 1 cc of Depo-Medrol 40 mg with several cc's of plain xylocaine. Anesthesia was provided by ethyl chloride and a 20-gauge needle was used to inject the hip area. The injection was tolerated well.  A band aid dressing was applied.  The patient was advised to apply ice later today and tomorrow to the injection sight as needed.  Return in two weeks.  Call if any problem.  Precautions discussed.   Electronically Signed Darreld McleanWayne Khiem Gargis, MD 11/27/20188:43 AM

## 2017-01-18 ENCOUNTER — Ambulatory Visit: Payer: Commercial Managed Care - PPO | Admitting: Orthopaedic Surgery

## 2017-01-20 ENCOUNTER — Encounter: Payer: Self-pay | Admitting: Orthopedic Surgery

## 2017-02-22 ENCOUNTER — Encounter (INDEPENDENT_AMBULATORY_CARE_PROVIDER_SITE_OTHER): Payer: Self-pay | Admitting: *Deleted

## 2017-04-25 HISTORY — PX: COLONOSCOPY: SHX174

## 2017-05-31 ENCOUNTER — Ambulatory Visit: Payer: BLUE CROSS/BLUE SHIELD | Admitting: Family Medicine

## 2017-06-01 ENCOUNTER — Ambulatory Visit (INDEPENDENT_AMBULATORY_CARE_PROVIDER_SITE_OTHER): Payer: Commercial Managed Care - PPO | Admitting: Family Medicine

## 2017-06-01 ENCOUNTER — Encounter: Payer: Self-pay | Admitting: Family Medicine

## 2017-06-01 VITALS — BP 118/72 | Ht 67.0 in | Wt 179.6 lb

## 2017-06-01 DIAGNOSIS — F411 Generalized anxiety disorder: Secondary | ICD-10-CM

## 2017-06-01 DIAGNOSIS — F5101 Primary insomnia: Secondary | ICD-10-CM

## 2017-06-01 MED ORDER — BUPROPION HCL ER (SR) 150 MG PO TB12: 150.0000 mg | ORAL_TABLET | Freq: Two times a day (BID) | ORAL | 3 refills | Status: DC

## 2017-06-01 NOTE — Progress Notes (Signed)
   Subjective:    Patient ID: Shawn ObeyJoseph E Benedict, male    DOB: 1975-08-15, 42 y.o.   MRN: [id number]  HPI  Patient arrives to discuss stress related issues and anxiety.   havoing moe trouble with stress than he used to  Brother has moved in with family, cause he is up and down with his hjob  Wife getting surgery  sork is crazy  Daughter just started driving  Pt more irritable  Increased and overwhekmed ssid e  Both parents tendency towards anxiety, a bit worse with getting older   Definite fam hx  Has been at the sherrif's office since 2010, was jail supervisor--very stressful, now transporting  Pt's   Finds himself gnawing and orrying about thing     Sleeps wel with melatonin    Feels down on occasion but not sig depressed  No thought's of  Suicide  Exercise --joined the gym, working out reg, four to five d per wk   Going on four to six months   Feeling nxoius but no true panic attacks         Review of Systems No headache, no major weight loss or weight gain, no chest pain no back pain abdominal pain no change in bowel habits complete ROS otherwise negative     Objective:   Physical Exam Alert and oriented, vitals reviewed and stable, NAD ENT-TM's and ext canals WNL bilat via otoscopic exam Soft palate, tonsils and post pharynx WNL via oropharyngeal exam Neck-symmetric, no masses; thyroid nonpalpable and nontender Pulmonary-no tachypnea or accessory muscle use; Clear without wheezes via auscultation Card--no abnrml murmurs, rhythm reg and rate WNL Carotid pulses symmetric, without bruits        Assessment & Plan:  Impression1 generalized anxiety disorder.  Long discussion held.  Stress at home with wife's sickness, daughter now driving, and brother-in-law have now moved into the house.  Also stress at work.  Change work duties.  Also a strong history of anxiety with depression.  No suicidal homicidal thoughts.  Long discussion regarding  medication.  Patient would like to try medicines.  Side effects benefits discussed Wellbutrin 150 SR initiated follow-up as scheduled.  Educational information given Greater than 50% of this 25 minute face to face visit was spent in counseling and discussion and coordination of care regarding the above diagnosis/diagnosies

## 2017-06-01 NOTE — Patient Instructions (Signed)

## 2017-08-23 ENCOUNTER — Encounter: Payer: Self-pay | Admitting: Family Medicine

## 2017-08-31 ENCOUNTER — Ambulatory Visit: Payer: Commercial Managed Care - PPO | Admitting: Family Medicine

## 2017-09-08 ENCOUNTER — Other Ambulatory Visit: Payer: Self-pay | Admitting: Family Medicine

## 2017-09-08 NOTE — Telephone Encounter (Signed)
1 refill keep follow-up visit 

## 2017-09-21 ENCOUNTER — Encounter: Payer: Self-pay | Admitting: Family Medicine

## 2017-09-21 ENCOUNTER — Ambulatory Visit (INDEPENDENT_AMBULATORY_CARE_PROVIDER_SITE_OTHER): Payer: Commercial Managed Care - PPO | Admitting: Family Medicine

## 2017-09-21 VITALS — BP 112/78 | Ht 67.0 in | Wt 176.4 lb

## 2017-09-21 DIAGNOSIS — F5101 Primary insomnia: Secondary | ICD-10-CM

## 2017-09-21 DIAGNOSIS — F411 Generalized anxiety disorder: Secondary | ICD-10-CM

## 2017-09-21 MED ORDER — BUPROPION HCL ER (XL) 300 MG PO TB24: 300.0000 mg | ORAL_TABLET | Freq: Every day | ORAL | 5 refills | Status: DC

## 2017-09-21 NOTE — Progress Notes (Signed)
   Subjective:    Patient ID: Shawn Moon, male    DOB: 04-14-1975, 42 y.o.   MRN: [id number]  HPI  Patient arrives for a follow up on insomnia.      Things overall have improved considerably  Overall doing much better  occas misses med  Exercise going ok   Getting back towatds exrtcise, and working on things  Melatonin helps ok     Review of Systems No headache, no major weight loss or weight gain, no chest pain no back pain abdominal pain no change in bowel habits complete ROS otherwise negative     Objective:   Physical Exam Alert vitals stable, NAD. Blood pressure good on repeat. HEENT normal. Lungs clear. Heart regular rate and rhythm.        Assessment & Plan:  Impression generalized anxiety disorder.  Much improved on Wellbutrin.  Also home situation has improved condition.  Sleeping better.  Less anxiety.  Less physical symptoms associated with anxiety.  Patient trying to increase his exercise level.  Some difficulty with compliance on twice a day Wellbutrin will change to XL formulation.  Follow-up in 6 months

## 2017-10-27 ENCOUNTER — Encounter: Payer: Self-pay | Admitting: Family Medicine

## 2017-10-27 ENCOUNTER — Other Ambulatory Visit: Payer: Self-pay | Admitting: *Deleted

## 2017-10-27 MED ORDER — MECLIZINE HCL 25 MG PO TABS: 25.0000 mg | ORAL_TABLET | Freq: Three times a day (TID) | ORAL | 0 refills | Status: DC | PRN

## 2018-03-24 ENCOUNTER — Ambulatory Visit: Payer: Commercial Managed Care - PPO | Admitting: Family Medicine

## 2018-08-25 ENCOUNTER — Other Ambulatory Visit: Payer: Self-pay

## 2018-08-25 ENCOUNTER — Ambulatory Visit (INDEPENDENT_AMBULATORY_CARE_PROVIDER_SITE_OTHER): Payer: Commercial Managed Care - PPO | Admitting: Family Medicine

## 2018-08-25 ENCOUNTER — Encounter: Payer: Self-pay | Admitting: Family Medicine

## 2018-08-25 VITALS — BP 122/82 | Temp 98.0°F | Ht 67.0 in | Wt 182.6 lb

## 2018-08-25 DIAGNOSIS — Z0000 Encounter for general adult medical examination without abnormal findings: Secondary | ICD-10-CM | POA: Diagnosis not present

## 2018-08-25 DIAGNOSIS — Z125 Encounter for screening for malignant neoplasm of prostate: Secondary | ICD-10-CM | POA: Diagnosis not present

## 2018-08-25 DIAGNOSIS — Z79899 Other long term (current) drug therapy: Secondary | ICD-10-CM | POA: Diagnosis not present

## 2018-08-25 MED ORDER — LORAZEPAM 1 MG PO TABS: ORAL_TABLET | ORAL | 3 refills | Status: DC

## 2018-08-25 NOTE — Progress Notes (Signed)
Subjective:    Patient ID: Shawn Moon, male    DOB: May 24, 1975, 43 y.o.   MRN: [id number]  HPI The patient comes in today for a wellness visit.    A review of their health history was completed.  A review of medications was also completed.  Any needed refills; none at this time  Eating habits: normal  Falls/  MVA accidents in past few months: none  Regular exercise: used to; but since COVID has not been able to   Specialist pt sees on regular basis: none  Preventative health issues were discussed.   Additional concerns: pt states he is having issue with sleeping at night. Pt has taken himself off of the Wellbuturin.     Pt has not prescrit meds yet     More of an issue lately   Melatonin was working now [employer]   , did not help   Then switched to  be Has fell off the  exercis o f late  Diet overall prety good    BW overall done   Fa had  rost ate cancer, has had for a long time    Colon  Cn cer g mo and fa had the yr before  evey five yr   h  x  Review of Systems  Constitutional: Negative for activity change, appetite change and fever.  HENT: Negative for congestion, mouth sores, nosebleeds and rhinorrhea.   Eyes: Negative for discharge.  Respiratory: Negative for cough and wheezing.   Cardiovascular: Negative for chest pain.  Gastrointestinal: Negative for abdominal pain, blood in stool and vomiting.  Genitourinary: Negative for difficulty urinating and frequency.  Musculoskeletal: Negative for neck pain.  Skin: Negative for rash.  Allergic/Immunologic: Negative for environmental allergies and food allergies.  Neurological: Negative for weakness and headaches.  Psychiatric/Behavioral: Negative for agitation.  All other systems reviewed and are negative.      Objective:   Physical Exam Constitutional:      Appearance: He is well-developed.  HENT:     Head: Normocephalic and atraumatic.     Right Ear: External ear normal.     Left  Ear: External ear normal.     Nose: Nose normal.  Eyes:     Pupils: Pupils are equal, round, and reactive to light.  Neck:     Musculoskeletal: Normal range of motion and neck supple.     Thyroid: No thyromegaly.  Cardiovascular:     Rate and Rhythm: Normal rate and regular rhythm.     Heart sounds: Normal heart sounds. No murmur.  Pulmonary:     Effort: Pulmonary effort is normal. No respiratory distress.     Breath sounds: Normal breath sounds. No wheezing.  Abdominal:     General: Bowel sounds are normal. There is no distension.     Palpations: Abdomen is soft. There is no mass.     Tenderness: There is no abdominal tenderness.  Genitourinary:    Penis: Normal.   Musculoskeletal: Normal range of motion.  Lymphadenopathy:     Cervical: No cervical adenopathy.  Skin:    General: Skin is warm and dry.     Findings: No erythema.  Neurological:     Mental Status: He is alert.     Motor: No abnormal muscle tone.  Psychiatric:        Behavior: Behavior normal.        Judgment: Judgment normal.           Assessment &  Plan:  Impression wellness exam.  Diet discussed.  Exercise discussed.  Screening blood work ordered.  Progressive challenges with insomnia.  Multiple options discussed.  Will cover with a generic benzodiazepine.  Use sparingly.

## 2019-02-20 ENCOUNTER — Encounter: Payer: Self-pay | Admitting: Family Medicine

## 2019-02-20 ENCOUNTER — Telehealth: Payer: Self-pay | Admitting: Family Medicine

## 2019-02-20 NOTE — Telephone Encounter (Signed)
Ok, pt can go with the latest cdc rec actually. He can get the test on Monday and if negative he can return at ten d also (next thur) tell pt I told wife 14 d but the latest rec if testing neg can return earlier

## 2019-02-20 NOTE — Telephone Encounter (Signed)
Patient is the husband to Shawn Moon who had a phone visit today with Dr. Brett Canales.  He said his wife told him that Dr. Brett Canales told him to call up here to find out how long he should stay out of work and when to go get testing since wife has covid.  Patient will also need a work note.

## 2019-02-20 NOTE — Telephone Encounter (Signed)
Patient notified and needs work excuse stating this for work- patient will get note thru my chart

## 2019-03-12 ENCOUNTER — Encounter: Payer: Self-pay | Admitting: Family Medicine

## 2019-08-10 ENCOUNTER — Telehealth: Payer: Self-pay | Admitting: Family Medicine

## 2019-08-10 NOTE — Telephone Encounter (Signed)
Last bw ordered 08/2018 and not done. Lipid, liver, bmp, psa

## 2019-08-10 NOTE — Telephone Encounter (Signed)
Pt has CPE on 7/28 and would like to know if lab work needs to be ordered.

## 2019-08-10 NOTE — Telephone Encounter (Signed)
Have pt get labs fasting, 1 wk prior Cbc, cmp, lipids, and psa.   Thx.   Dr. Ladona Ridgel

## 2019-08-10 NOTE — Telephone Encounter (Signed)
Called pt to let him know to do bw and he states he just had bw done this morning through his employer and he will drop off bw next Wednesday ( 08/15/19) hold message til then to see if he needs any additional labs.

## 2019-09-03 ENCOUNTER — Telehealth: Payer: Self-pay | Admitting: Family Medicine

## 2019-09-03 NOTE — Telephone Encounter (Signed)
Shawn Moon dropped his blood work off for his appt for Wed placed in Dr Ladona Ridgel folder

## 2019-09-05 ENCOUNTER — Ambulatory Visit (INDEPENDENT_AMBULATORY_CARE_PROVIDER_SITE_OTHER): Payer: Commercial Managed Care - PPO | Admitting: Family Medicine

## 2019-09-05 ENCOUNTER — Other Ambulatory Visit: Payer: Self-pay

## 2019-09-05 ENCOUNTER — Encounter: Payer: Self-pay | Admitting: Family Medicine

## 2019-09-05 VITALS — BP 128/82 | HR 89 | Temp 97.7°F | Ht 65.5 in | Wt 179.0 lb

## 2019-09-05 DIAGNOSIS — E559 Vitamin D deficiency, unspecified: Secondary | ICD-10-CM | POA: Diagnosis not present

## 2019-09-05 DIAGNOSIS — Z0000 Encounter for general adult medical examination without abnormal findings: Secondary | ICD-10-CM | POA: Insufficient documentation

## 2019-09-05 DIAGNOSIS — Z23 Encounter for immunization: Secondary | ICD-10-CM | POA: Diagnosis not present

## 2019-09-05 DIAGNOSIS — R42 Dizziness and giddiness: Secondary | ICD-10-CM | POA: Diagnosis not present

## 2019-09-05 MED ORDER — MECLIZINE HCL 25 MG PO TABS: 25.0000 mg | ORAL_TABLET | Freq: Three times a day (TID) | ORAL | 0 refills | Status: DC | PRN

## 2019-09-05 NOTE — Progress Notes (Signed)
Patient ID: Shawn Moon, male    DOB: 1975-12-28, 44 y.o.   MRN: [id number]   Chief Complaint  Patient presents with  . Annual Exam   Subjective:    HPI  Pt seen for wellness visit. No concerns for today. Labs brought in from outside source and reviewed.  Vit D slightly below normal. Past year has changed from smoking cigarettes to vaping. Counseled on importance of stopping all tobacco products. Plans to gradually taper off and will shoot for next year to be totally off all tobacco products (cigs and vape).   Saw derm, Dr. Orvan Falconer in [place] for spot on left side of face under eye- removed and biopsied: negative.   In 2015 had bil. Ear infection and since that time has had tinnitus and occ. Vertigo (three times per year). Uses Meclizine as needed.  Intermittent fasting (IF) resulting in much improvement in total cholesterol. Weight loss as a result to IF.    A review of their health history was completed.  A review of medications was also completed.  Any needed refills; meclizine  Eating habits: eats healthy   Falls/  MVA accidents in past few months: none  Regular exercise: tries to exercise regular; tries to watch what he eats mostly   Specialist pt sees on regular basis: none  Preventative health issues were discussed.   Additional concerns: none  Medical History [place] has a past medical history of GERD (gastroesophageal reflux disease).   Outpatient Encounter Medications as of 09/05/2019  Medication Sig  . ibuprofen (ADVIL,MOTRIN) 400 MG tablet Take 400 mg by mouth every 6 (six) hours as needed for pain.  Marland Kitchen loratadine (CLARITIN) 10 MG tablet Take 10 mg by mouth daily.    . meclizine (ANTIVERT) 25 MG tablet Take 1 tablet (25 mg total) by mouth 3 (three) times daily as needed for dizziness.  . Melatonin 5 MG CAPS Take 5 mg by mouth as needed.  . naproxen (NAPROSYN) 500 MG tablet prn  . [DISCONTINUED] meclizine (ANTIVERT) 25 MG tablet Take 1 tablet (25 mg  total) by mouth 3 (three) times daily as needed for dizziness.  . [DISCONTINUED] buPROPion (WELLBUTRIN XL) 300 MG 24 hr tablet Take 1 tablet (300 mg total) by mouth daily. (Patient not taking: Reported on 08/25/2018)  . [DISCONTINUED] LORazepam (ATIVAN) 1 MG tablet Take one tablet po qhs prn sleep   No facility-administered encounter medications on file as of 09/05/2019.     Review of Systems  Constitutional: Negative for appetite change, chills, fatigue and fever.  Eyes: Negative.   Respiratory: Negative.   Cardiovascular: Negative.   Gastrointestinal: Negative for abdominal pain, blood in stool, constipation, diarrhea, nausea and vomiting.  Endocrine: Negative.   Genitourinary: Negative for difficulty urinating, discharge, penile pain, penile swelling and scrotal swelling.  Musculoskeletal: Negative.   Skin: Negative.   Allergic/Immunologic: Negative.   Neurological: Negative.   Hematological: Negative.   Psychiatric/Behavioral: Negative.      Vitals BP 128/82   Pulse 89   Temp 97.7 F (36.5 C)   Ht 5' 5.5" (1.664 m)   Wt 179 lb (81.2 kg)   SpO2 98%   BMI 29.33 kg/m   Objective:   Physical Exam Vitals and nursing note reviewed.  Constitutional:      General: He is not in acute distress.    Appearance: Normal appearance. He is not ill-appearing.  HENT:     Head: Normocephalic and atraumatic.     Right Ear: Tympanic  membrane, ear canal and external ear normal.     Left Ear: Tympanic membrane, ear canal and external ear normal.     Nose: Nose normal. No congestion.     Mouth/Throat:     Mouth: Mucous membranes are moist.     Pharynx: No oropharyngeal exudate or posterior oropharyngeal erythema.  Eyes:     Extraocular Movements: Extraocular movements intact.     Conjunctiva/sclera: Conjunctivae normal.     Pupils: Pupils are equal, round, and reactive to light.  Cardiovascular:     Rate and Rhythm: Normal rate and regular rhythm.     Pulses: Normal pulses.      Heart sounds: Normal heart sounds. No murmur heard.   Pulmonary:     Effort: Pulmonary effort is normal. No respiratory distress.     Breath sounds: Normal breath sounds. No wheezing, rhonchi or rales.  Abdominal:     General: Bowel sounds are normal. There is no distension.     Palpations: There is no mass.     Tenderness: There is no abdominal tenderness. There is no guarding.     Hernia: No hernia is present.  Musculoskeletal:        General: Normal range of motion.     Cervical back: Normal range of motion.     Right lower leg: No edema.     Left lower leg: No edema.  Skin:    General: Skin is warm and dry.     Findings: No rash.  Neurological:     General: No focal deficit present.     Mental Status: He is alert and oriented to person, place, and time.     Cranial Nerves: No cranial nerve deficit.  Psychiatric:        Mood and Affect: Mood normal.        Behavior: Behavior normal.        Thought Content: Thought content normal.        Judgment: Judgment normal.      Assessment and Plan   1. Encounter for wellness examination in adult  2. Vertigo - meclizine (ANTIVERT) 25 MG tablet; Take 1 tablet (25 mg total) by mouth 3 (three) times daily as needed for dizziness.  Dispense: 30 tablet; Refill: 0  3. Need for vaccination - Tdap vaccine greater than or equal to 7yo IM  4. Vitamin D deficiency   Vertigo- stable.  No concerns today. Uses meclizine as needed - usually about 3 times per year. Usually last for 24 hours and resolves. Refill sent.  Vit d defic- Take otc vit D 2000 units.    prediabetses- improving. cont to watch carb intake and inc in exercising.  F/u 1 yr or prn.

## 2019-09-05 NOTE — Patient Instructions (Signed)
Work towards stopping all tobacco/vaping products.      Electronic Cigarette Information  Electronic cigarettes, or e-cigarettes, are battery-operated devices that deliver nicotine--a very addictive drug--to the body. They come in many shapes, including in the shape of a cigarette, pipe, pen, and even a USB memory stick. E-cigarettes have a cartridge that contains a liquid form of nicotine. When a person uses the device, the liquid heats up. It then becomes a vapor. Inhaling this vapor is called vaping. Nicotine is thought to increase your risk for certain types of cancer. In addition to nicotine, e-cigarettes may contain other harmful and cancer-causing chemicals, including:  Formaldehyde.  Acetaldehyde.  Heavy metals.  Ultra-fine particles that can get inhaled deep into the lungs.  Chemical colorings and flavorings. It is not clear how much nicotine you get when vaping, and it is hard to know what chemicals are in the vaping liquids. The health effects of vaping are not completely known, but you should be aware of the possible dangers of using these products. Some people may use e-cigarettes in order to quit smoking tobacco. However, this has not been proven to work, and the Education officer, environmental (FDA) has not approved e-cigarettes for this purpose. How can using electronic cigarettes affect me?  You may be at risk for developing a dangerous lung disease. There are reports of an increasing number of cases involving serious lung problems, and even death, associated with e-cigarette use. Your risk may be even higher if you: ? Buy e-cigarettes or vaping oils off the street. ? Add any substances to the e-cigarettes that are not intended by the manufacturer.  Vaping may make you crave nicotine. Nicotine does the following: ? Changes your blood sugar levels. ? Increases your heart rate, blood pressure, and breathing rate. ? Increases your risk of developing blood clots  (hypercoagulable state) and diabetes. ? Increases your risk of gum disease that may lead to losing teeth.  If you smoke e-cigarettes, you may be more likely to start smoking or to smoke more tobacco cigarettes.  Becoming addicted to nicotine may make your brain more sensitive to other addictive drugs. You may move to other addictive substances.  You may be in danger of overdosing on nicotine. Nicotine poisoning can cause nausea, vomiting, seizures, and trouble breathing.  An e-cigarette may explode and cause fires and burn injuries.  If you are pregnant, the nicotine in e-cigarettes may be harmful to your baby. Nicotine can cause: ? Brain or lung problems for your baby. ? Your baby to be born too early. ? Your baby to be born with a low birth weight.  Vaping has also been linked to decreases in memory and attention span in children and teens. What actions can I take to stop vaping? If you can, stop vaping on your own before you become addicted to nicotine. If you need help quitting, ask your health care provider. There are three effective ways to fight nicotine addiction:  Nicotine replacement therapy. Using nicotine gum or a nicotine patch blocks your craving for nicotine. Over time, you can reduce the amount of nicotine you use until you can stop using nicotine completely without having cravings.  Prescription medicines approved to fight nicotine addiction. These stop nicotine cravings or block the effects of nicotine.  Behavioral therapy. This may include: ? A self-help smoking cessation program. ? Individual or group therapy. ? A smoking cessation support group. There are several national programs to help you quit smoking or vaping. These include:  Text message programs, such as SmokefreeTXT.  Apps for mobile phones, including the free quitSTART app.  Hotlines, such as 1-800-QUIT-NOW (862)742-0296). Where to find support You can get support at these sites:  U.S. Department  of Health and Human Services: https://smokefree.gov  American Lung Association: www.lung.org Where to find more information Learn more about e-cigarettes from:  [employer] on Drug Abuse: http://www.price-smith.com/  Centers for Disease Control and Prevention: FootballExhibition.com.br Summary  E-cigarettes can cause nicotine addiction.  E-cigarettes are not approved as a way to stop smoking. They are not a risk-free alternative to smoking tobacco.  There are reports of an increasing number of cases involving serious lung problems, and even death, associated with e-cigarette use.  If you can stop vaping on your own, do it before you become addicted to nicotine. If you need help quitting, ask your health care provider.  There are various methods and programs that can help you stop smoking or vaping. This information is not intended to replace advice given to you by your health care provider. Make sure you discuss any questions you have with your health care provider. Document Revised: 05/23/2018 Document Reviewed: 04/07/2018 Elsevier Patient Education  2020 [employer].

## 2020-02-21 ENCOUNTER — Other Ambulatory Visit: Payer: Commercial Managed Care - PPO

## 2020-02-21 DIAGNOSIS — Z20822 Contact with and (suspected) exposure to covid-19: Secondary | ICD-10-CM

## 2020-02-23 LAB — NOVEL CORONAVIRUS, NAA: SARS-CoV-2, NAA: NOT DETECTED

## 2020-02-23 LAB — SARS-COV-2, NAA 2 DAY TAT

## 2020-07-14 ENCOUNTER — Other Ambulatory Visit: Payer: Self-pay

## 2020-07-14 ENCOUNTER — Telehealth: Payer: Self-pay | Admitting: Family Medicine

## 2020-07-14 ENCOUNTER — Ambulatory Visit
Admission: EM | Admit: 2020-07-14 | Discharge: 2020-07-14 | Disposition: A | Discharge status: Home / Self Care | Payer: Commercial Managed Care - PPO | Attending: Family Medicine | Admitting: Family Medicine

## 2020-07-14 DIAGNOSIS — B349 Viral infection, unspecified: Secondary | ICD-10-CM | POA: Diagnosis not present

## 2020-07-14 DIAGNOSIS — Z1152 Encounter for screening for COVID-19: Secondary | ICD-10-CM

## 2020-07-14 DIAGNOSIS — R059 Cough, unspecified: Secondary | ICD-10-CM

## 2020-07-14 DIAGNOSIS — H9313 Tinnitus, bilateral: Secondary | ICD-10-CM

## 2020-07-14 MED ORDER — PREDNISONE 10 MG (21) PO TBPK: ORAL_TABLET | Freq: Every day | ORAL | 0 refills | Status: AC

## 2020-07-14 MED ORDER — MOLNUPIRAVIR EUA 200MG CAPSULE: 4.0000 | ORAL_CAPSULE | Freq: Two times a day (BID) | ORAL | 0 refills | Status: AC

## 2020-07-14 MED ORDER — GUAIFENESIN-CODEINE 100-10 MG/5ML PO SYRP: 5.0000 mL | ORAL_SOLUTION | Freq: Three times a day (TID) | ORAL | 0 refills | Status: DC | PRN

## 2020-07-14 NOTE — Telephone Encounter (Signed)
Did not send in prednisone at initial visit today.

## 2020-07-14 NOTE — Discharge Instructions (Addendum)
I have sent in a prednisone taper for you to take for 6 days. 6 tablets on day one, 5 tablets on day two, 4 tablets on day three, 3 tablets on day four, 2 tablets on day five, and 1 tablet on day six.  May take magnesium OTC for tinnitus  I have sent in cough syrup for you to take. This medication can make you sleepy. Do not drive while taking this medication.  Your COVID and Influenza tests are pending.  You should self quarantine until the test results are back.    Take Tylenol or ibuprofen as needed for fever or discomfort.  Rest and keep yourself hydrated.    Follow-up with your primary care provider if your symptoms are not improving.

## 2020-07-14 NOTE — ED Triage Notes (Signed)
Pt presents with nasal congestion cough and sore throat that began on Saturday, positive home covid test

## 2020-07-16 LAB — SARS-COV-2, NAA 2 DAY TAT

## 2020-07-16 LAB — NOVEL CORONAVIRUS, NAA: SARS-CoV-2, NAA: DETECTED — AB

## 2020-07-18 NOTE — ED Provider Notes (Signed)
[facility]   [id number] 07/14/20 Arrival Time: 0827   CC: COVID symptoms  SUBJECTIVE: History from: patient.  Shawn Moon is a 45 y.o. male who presents with nasal congestion, fatigue, body aches, cough and sore throat that began x 2 days ago. Reports positive home Covid test this morning. Denies sick exposure to COVID, flu or strep. Denies recent travel. Has negative history of Covid. Has completed Covid vaccines. Has completed flu vaccine. Has not taken OTC medications for this. There are no aggravating or alleviating factors. Denies previous symptoms in the past. Denies fever, chills, sinus pain, rhinorrhea, SOB, wheezing, chest pain, nausea, changes in bowel or bladder habits.    ROS: As per HPI.  All other pertinent ROS negative.     Past Medical History:  Diagnosis Date   GERD (gastroesophageal reflux disease)    Past Surgical History:  Procedure Laterality Date   ESOPHAGOGASTRODUODENOSCOPY  11/11   Normal esophagus,status post softer biopsy; Couple of tiny antral erosions of doubtful clinical significance otherwise normal stomach   Allergies  Allergen Reactions   Clarithromycin    Penicillins    No current facility-administered medications on file prior to encounter.   Current Outpatient Medications on File Prior to Encounter  Medication Sig Dispense Refill   ibuprofen (ADVIL,MOTRIN) 400 MG tablet Take 400 mg by mouth every 6 (six) hours as needed for pain.     loratadine (CLARITIN) 10 MG tablet Take 10 mg by mouth daily.       meclizine (ANTIVERT) 25 MG tablet Take 1 tablet (25 mg total) by mouth 3 (three) times daily as needed for dizziness. 30 tablet 0   Melatonin 5 MG CAPS Take 5 mg by mouth as needed.     naproxen (NAPROSYN) 500 MG tablet prn     Social History   Socioeconomic History   Marital status: Married    Spouse name: Not on file   Number of children: Not on file   Years of education: Not on file   Highest education level: Not on file   Occupational History   Not on file  Tobacco Use   Smoking status: Former    Pack years: 0.00   Smokeless tobacco: Never  Substance and Sexual Activity   Alcohol use: No   Drug use: No   Sexual activity: Not on file  Other Topics Concern   Not on file  Social History Narrative   Not on file   Social Determinants of Health   Financial Resource Strain: Not on file  Food Insecurity: Not on file  Transportation Needs: Not on file  Physical Activity: Not on file  Stress: Not on file  Social Connections: Not on file  Intimate Partner Violence: Not on file   Family History  Problem Relation Age of Onset   Colon cancer Paternal Grandmother    Diabetes Paternal Grandmother    Hypertension Father    Diabetes Maternal Grandmother     OBJECTIVE:  Vitals:   07/14/20 0844  BP: 108/73  Pulse: 90  Resp: 20  Temp: 98.2 F (36.8 C)  SpO2: 96%     General appearance: alert; appears fatigued, but nontoxic; speaking in full sentences and tolerating own secretions HEENT: NCAT; Ears: EACs clear, TMs pearly gray; Eyes: PERRL.  EOM grossly intact. Sinuses: nontender; Nose: nares patent with clear rhinorrhea, Throat: oropharynx erythematous, cobblestoning present, tonsils non erythematous or enlarged, uvula midline  Neck: supple with LAD Lungs: unlabored respirations, symmetrical air entry; cough: mild; no  respiratory distress; CTAB Heart: regular rate and rhythm.  Radial pulses 2+ symmetrical bilaterally Skin: warm and dry Psychological: alert and cooperative; normal mood and affect  LABS:  No results found for this or any previous visit (from the past 24 hour(s)).   ASSESSMENT & PLAN:  1. Encounter for screening for COVID-19   2. Viral illness   3. Cough   4. Tinnitus of both ears     Meds ordered this encounter  Medications   guaiFENesin-codeine (ROBITUSSIN AC) 100-10 MG/5ML syrup    Sig: Take 5 mLs by mouth 3 (three) times daily as needed for cough.    Dispense:  240  mL    Refill:  0    Order Specific Question:   Supervising Provider    Answer:   Merrilee Jansky [id number]   molnupiravir EUA 200 mg CAPS    Sig: Take 4 capsules (800 mg total) by mouth 2 (two) times daily for 5 days.    Dispense:  40 capsule    Refill:  0    Order Specific Question:   Supervising Provider    Answer:   Merrilee Jansky [id number]   Prescribed chertussin Sedation precautions given Paper rx for Molnupiravir given in case of positive Covid with moderate symptoms Continue supportive care at home COVID and flu testing ordered.  It will take between 2-3 days for test results. Someone will contact you regarding abnormal results.   Work note provided Patient should remain in quarantine until they have received Covid results.  If negative you may resume normal activities (go back to work/school) while practicing hand hygiene, social distance, and mask wearing.  If positive, patient should remain in quarantine for at least 5 days from symptom onset AND greater than 72 hours after symptoms resolution (absence of fever without the use of fever-reducing medication and improvement in respiratory symptoms), whichever is longer Get plenty of rest and push fluids Use OTC zyrtec for nasal congestion, runny nose, and/or sore throat Use OTC flonase for nasal congestion and runny nose Use medications daily for symptom relief Use OTC medications like ibuprofen or tylenol as needed fever or pain Call or go to the ED if you have any new or worsening symptoms such as fever, worsening cough, shortness of breath, chest tightness, chest pain, turning blue, changes in mental status.  Reviewed expectations re: course of current medical issues. Questions answered. Outlined signs and symptoms indicating need for more acute intervention. Patient verbalized understanding. After Visit Summary given.          Moshe Cipro, NP 07/18/20 1058

## 2020-09-10 ENCOUNTER — Encounter: Payer: Commercial Managed Care - PPO | Admitting: Family Medicine

## 2020-11-25 ENCOUNTER — Encounter: Payer: Self-pay | Admitting: Family Medicine

## 2020-11-25 ENCOUNTER — Other Ambulatory Visit: Payer: Self-pay

## 2020-11-25 ENCOUNTER — Ambulatory Visit (INDEPENDENT_AMBULATORY_CARE_PROVIDER_SITE_OTHER): Payer: Commercial Managed Care - PPO | Admitting: Family Medicine

## 2020-11-25 VITALS — BP 126/84 | HR 98 | Temp 98.2°F | Ht 64.75 in | Wt 179.8 lb

## 2020-11-25 DIAGNOSIS — R6882 Decreased libido: Secondary | ICD-10-CM | POA: Diagnosis not present

## 2020-11-25 DIAGNOSIS — Z0000 Encounter for general adult medical examination without abnormal findings: Secondary | ICD-10-CM

## 2020-11-25 DIAGNOSIS — Z0001 Encounter for general adult medical examination with abnormal findings: Secondary | ICD-10-CM | POA: Diagnosis not present

## 2020-11-25 DIAGNOSIS — R7303 Prediabetes: Secondary | ICD-10-CM | POA: Insufficient documentation

## 2020-11-25 DIAGNOSIS — N529 Male erectile dysfunction, unspecified: Secondary | ICD-10-CM | POA: Insufficient documentation

## 2020-11-25 DIAGNOSIS — E559 Vitamin D deficiency, unspecified: Secondary | ICD-10-CM | POA: Insufficient documentation

## 2020-11-25 MED ORDER — AZELASTINE-FLUTICASONE 137-50 MCG/ACT NA SUSP: 1.0000 | Freq: Two times a day (BID) | NASAL | 1 refills | Status: DC

## 2020-11-25 MED ORDER — TADALAFIL 5 MG PO TABS: 5.0000 mg | ORAL_TABLET | Freq: Every day | ORAL | 3 refills | Status: DC | PRN

## 2020-11-25 NOTE — Progress Notes (Signed)
Subjective:  Patient ID: Shawn Moon, male    DOB: 12/01/1975  Age: 45 y.o. MRN: [id number]  CC:   HPI TODRICK SIEDSCHLAG is a 45 y.o. male presents to the clinic today for an annual physical exam.  Preventative Healthcare: Colonoscopy: Up to date (2019). Immunizations Tetanus - Up to date. Flu - Up to date. Declines additional COVID vaccination. Hepatitis C screening - Declines. Labs: Has had recent labs through his employer. Alcohol use: No. Smoking/tobacco use: Currently Vaping. STD/HIV testing: Declines. Regular dental exams: Yes.  Current concerns:  Nasal Congestion  Ongoing. Worst at night. Has been using Afrin twice daily for nearly a year. Has tried antihistamine and flonase without improvement. Responds well to afrin.  Low libido Decrease in sex drive for the past year.  Associated ED (trouble getting and maintaining an erection). This also impacts sex drive. Has had testosterone check previously and was reportedly normal.   PMH, Surgical Hx, Family Hx, Social History reviewed and updated as below.  Past Medical History:  Diagnosis Date   GERD (gastroesophageal reflux disease)    Hyperlipidemia    Past Surgical History:  Procedure Laterality Date   COLONOSCOPY  04/25/2017   ESOPHAGOGASTRODUODENOSCOPY  12/09/2009   Normal esophagus,status post softer biopsy; Couple of tiny antral erosions of doubtful clinical significance otherwise normal stomach   Family History  Problem Relation Age of Onset   Colon cancer Paternal Grandmother    Diabetes Paternal Grandmother    Hypertension Father    Diabetes Maternal Grandmother    Social History   Tobacco Use   Smoking status: Former   Smokeless tobacco: Never  Substance Use Topics   Alcohol use: No    Review of Systems  HENT:  Positive for congestion.   Genitourinary:        Low libido. Erectile dysfunction.  All other systems reviewed and are negative.  Objective:   Today's Vitals: BP 126/84    Pulse 98   Temp 98.2 F (36.8 C)   Ht 5' 4.75" (1.645 m)   Wt 179 lb 12.8 oz (81.6 kg)   SpO2 99%   BMI 30.15 kg/m   Physical Exam Vitals and nursing note reviewed.  Constitutional:      General: He is not in acute distress.    Appearance: Normal appearance. He is not ill-appearing.  HENT:     Head: Normocephalic and atraumatic.     Right Ear: Tympanic membrane normal.     Left Ear: Tympanic membrane normal.     Nose: Congestion present.     Mouth/Throat:     Pharynx: Oropharynx is clear. No oropharyngeal exudate or posterior oropharyngeal erythema.  Eyes:     General:        Right eye: No discharge.        Left eye: No discharge.     Conjunctiva/sclera: Conjunctivae normal.  Cardiovascular:     Rate and Rhythm: Normal rate and regular rhythm.     Heart sounds: No murmur heard. Pulmonary:     Effort: Pulmonary effort is normal.     Breath sounds: Normal breath sounds. No wheezing or rales.  Abdominal:     General: There is no distension.     Palpations: Abdomen is soft.     Tenderness: There is no abdominal tenderness.  Skin:    General: Skin is warm.     Findings: No rash.  Neurological:     Mental Status: He is alert.  Psychiatric:  Mood and Affect: Mood normal.        Behavior: Behavior normal.     Assessment & Plan:   Problem List Items Addressed This Visit       Other   Encounter for wellness examination in adult - Primary    Preventative health care discussed and updated today.  Reviewed his recent labs. He will continue lifestyle changes/interventions regarding mildly elevated cholesterol. Immunizations up to date (excluding COVID which he declines). Hep C and HIV screening declines. Colonoscopy up to date.       Erectile dysfunction    Starting on Tadalafil.       Low libido    Wants to hold off on checking testosterone. Wants to proceed with trial of Tadalafil.        Meds ordered this encounter  Medications   tadalafil (CIALIS)  5 MG tablet    Sig: Take 1-2 tablets (5-10 mg total) by mouth daily as needed for erectile dysfunction.    Dispense:  30 tablet    Refill:  3   Azelastine-Fluticasone 137-50 MCG/ACT SUSP    Sig: Place 1 spray into the nose every 12 (twelve) hours.    Dispense:  23 g    Refill:  1     Follow-up: Return in about 1 year (around 11/25/2021), or if symptoms worsen or fail to improve.  Everlene Other DO [facility]

## 2020-11-25 NOTE — Patient Instructions (Signed)
Medications as prescribed.  Try and cut out the Afrin.  Follow up annually or sooner if needed.  Take care  Dr. Adriana Simas

## 2020-11-25 NOTE — Assessment & Plan Note (Signed)
Preventative health care discussed and updated today.  Reviewed his recent labs. He will continue lifestyle changes/interventions regarding mildly elevated cholesterol. Immunizations up to date (excluding COVID which he declines). Hep C and HIV screening declines. Colonoscopy up to date.

## 2020-11-25 NOTE — Assessment & Plan Note (Signed)
Wants to hold off on checking testosterone. Wants to proceed with trial of Tadalafil.

## 2020-11-25 NOTE — Assessment & Plan Note (Signed)
Starting on Tadalafil.

## 2021-01-12 ENCOUNTER — Other Ambulatory Visit: Payer: Self-pay

## 2021-01-12 ENCOUNTER — Encounter: Payer: Self-pay | Admitting: Emergency Medicine

## 2021-01-12 ENCOUNTER — Ambulatory Visit
Admission: EM | Admit: 2021-01-12 | Discharge: 2021-01-12 | Disposition: A | Discharge status: Home / Self Care | Payer: Commercial Managed Care - PPO | Attending: Physician Assistant | Admitting: Physician Assistant

## 2021-01-12 DIAGNOSIS — R059 Cough, unspecified: Secondary | ICD-10-CM | POA: Diagnosis not present

## 2021-01-12 DIAGNOSIS — J0120 Acute ethmoidal sinusitis, unspecified: Secondary | ICD-10-CM

## 2021-01-12 MED ORDER — DOXYCYCLINE HYCLATE 100 MG PO CAPS: 100.0000 mg | ORAL_CAPSULE | Freq: Two times a day (BID) | ORAL | 0 refills | Status: DC

## 2021-01-12 NOTE — ED Triage Notes (Addendum)
Pt reports cough, congestion 3-4 days. Pt reports home covid test was negative on Saturday.pt reports was seen by "[facility]" and prescribed nasal spray and tessalon pearls but reports did not pick them up because they don't work for me.

## 2021-01-12 NOTE — Discharge Instructions (Addendum)
Return if any problems.

## 2021-01-13 LAB — COVID-19, FLU A+B NAA
Influenza A, NAA: NOT DETECTED
Influenza B, NAA: NOT DETECTED
SARS-CoV-2, NAA: NOT DETECTED

## 2021-01-16 NOTE — ED Provider Notes (Signed)
[facility]    CSN: [id number] Arrival date & time: 01/12/21  1647      History   Chief Complaint Chief Complaint  Patient presents with   Cough    HPI Shawn Moon is a 45 y.o. male.   Pt complains of sinus pressure and congestion for 4 days   The history is provided by the patient. No language interpreter was used.  Cough Cough characteristics:  Non-productive Sputum characteristics:  Nondescript Severity:  Moderate Onset quality:  Gradual Timing:  Constant Progression:  Worsening Smoker: no   Relieved by:  Nothing Worsened by:  Nothing Ineffective treatments:  None tried Associated symptoms: headaches and sinus congestion    Past Medical History:  Diagnosis Date   GERD (gastroesophageal reflux disease)    Hyperlipidemia     Patient Active Problem List   Diagnosis Date Noted   Prediabetes 11/25/2020   Vitamin D deficiency 11/25/2020   Low libido 11/25/2020   Erectile dysfunction 11/25/2020   Encounter for wellness examination in adult 09/05/2019   Generalized anxiety disorder 06/01/2017    Past Surgical History:  Procedure Laterality Date   COLONOSCOPY  04/25/2017   ESOPHAGOGASTRODUODENOSCOPY  12/09/2009   Normal esophagus,status post softer biopsy; Couple of tiny antral erosions of doubtful clinical significance otherwise normal stomach       Home Medications    Prior to Admission medications   Medication Sig Start Date End Date Taking? Authorizing Provider  doxycycline (VIBRAMYCIN) 100 MG capsule Take 1 capsule (100 mg total) by mouth 2 (two) times daily. 01/12/21  Yes Elson Areas, PA-C  Azelastine-Fluticasone 137-50 MCG/ACT SUSP Place 1 spray into the nose every 12 (twelve) hours. Patient not taking: Reported on 01/12/2021 11/25/20   Tommie Sams, DO  ibuprofen (ADVIL,MOTRIN) 400 MG tablet Take 400 mg by mouth every 6 (six) hours as needed for pain.    [provider]  meclizine (ANTIVERT) 25 MG tablet Take 1 tablet  (25 mg total) by mouth 3 (three) times daily as needed for dizziness. 09/05/19   Laroy Apple M, DO  Melatonin 5 MG CAPS Take 5 mg by mouth as needed.    [provider]  naproxen (NAPROSYN) 500 MG tablet prn 08/11/18   [provider]  tadalafil (CIALIS) 5 MG tablet Take 1-2 tablets (5-10 mg total) by mouth daily as needed for erectile dysfunction. Patient not taking: Reported on 01/12/2021 11/25/20   Tommie Sams, DO    Family History Family History  Problem Relation Age of Onset   Colon cancer Paternal Grandmother    Diabetes Paternal Grandmother    Hypertension Father    Diabetes Maternal Grandmother     Social History Social History   Tobacco Use   Smoking status: Former   Smokeless tobacco: Never  Substance Use Topics   Alcohol use: No   Drug use: No     Allergies   Clarithromycin and Penicillins   Review of Systems Review of Systems  Respiratory:  Positive for cough.   Neurological:  Positive for headaches.  All other systems reviewed and are negative.   Physical Exam Triage Vital Signs ED Triage Vitals [01/12/21 1711]  Enc Vitals Group     BP 121/83     Pulse Rate 96     Resp 18     Temp 98.4 F (36.9 C)     Temp Source Oral     SpO2 97 %     Weight 175 lb (79.4 kg)  Height 5\' 6"  (1.676 m)     Head Circumference      Peak Flow      Pain Score 7     Pain Loc      Pain Edu?      Excl. in GC?    No data found.  Updated Vital Signs BP 121/83 (BP Location: Right Arm)   Pulse 96   Temp 98.4 F (36.9 C) (Oral)   Resp 18   Ht 5\' 6"  (1.676 m)   Wt 79.4 kg   SpO2 97%   BMI 28.25 kg/m   Visual Acuity Right Eye Distance:   Left Eye Distance:   Bilateral Distance:    Right Eye Near:   Left Eye Near:    Bilateral Near:     Physical Exam Vitals and nursing note reviewed.  Constitutional:      Appearance: He is well-developed.  HENT:     Head: Normocephalic.     Right Ear: Tympanic membrane normal.     Left Ear:  Tympanic membrane normal.     Mouth/Throat:     Mouth: Mucous membranes are moist.  Cardiovascular:     Rate and Rhythm: Normal rate.  Pulmonary:     Effort: Pulmonary effort is normal.  Abdominal:     General: Abdomen is flat. There is no distension.  Musculoskeletal:        General: Normal range of motion.     Cervical back: Normal range of motion.  Skin:    General: Skin is warm.  Neurological:     General: No focal deficit present.     Mental Status: He is alert and oriented to person, place, and time.     UC Treatments / Results  Labs (all labs ordered are listed, but only abnormal results are displayed) Labs Reviewed  COVID-19, FLU A+B NAA   Narrative:    Performed at:  [address] [place], [address]  [place] Lab Director: [place] MD, Phone:  [phone]    EKG   Radiology No results found.  Procedures Procedures (including critical care time)  Medications Ordered in UC Medications - No data to display  Initial Impression / Assessment and Plan / UC Course  I have reviewed the triage vital signs and the nursing notes.  Pertinent labs & imaging results that were available during my care of the patient were reviewed by me and considered in my medical decision making (see chart for details).     MDM:  Pt symptoms consistent with sinus infection   Final Clinical Impressions(s) / UC Diagnoses   Final diagnoses:  Cough, unspecified type  Acute ethmoidal sinusitis, recurrence not specified     Discharge Instructions      Return if any problems.    ED Prescriptions     Medication Sig Dispense Auth. Provider   doxycycline (VIBRAMYCIN) 100 MG capsule Take 1 capsule (100 mg total) by mouth 2 (two) times daily. 20 capsule Jolene Schimke, [id number]      PDMP not reviewed this encounter. An After Visit Summary was printed and given to the patient.    Elson Areas, [place] 01/16/21 1024

## 2021-04-17 ENCOUNTER — Other Ambulatory Visit: Payer: Self-pay | Admitting: Family Medicine

## 2021-11-29 ENCOUNTER — Emergency Department (HOSPITAL_COMMUNITY)
Admission: EM | Admit: 2021-11-29 | Discharge: 2021-11-29 | Disposition: A | Discharge status: Home / Self Care | Payer: Commercial Managed Care - PPO | Attending: Emergency Medicine | Admitting: Emergency Medicine

## 2021-11-29 ENCOUNTER — Other Ambulatory Visit: Payer: Self-pay

## 2021-11-29 ENCOUNTER — Encounter (HOSPITAL_COMMUNITY): Payer: Self-pay

## 2021-11-29 ENCOUNTER — Emergency Department (HOSPITAL_COMMUNITY): Payer: Commercial Managed Care - PPO

## 2021-11-29 DIAGNOSIS — R0602 Shortness of breath: Secondary | ICD-10-CM | POA: Insufficient documentation

## 2021-11-29 DIAGNOSIS — R519 Headache, unspecified: Secondary | ICD-10-CM | POA: Insufficient documentation

## 2021-11-29 DIAGNOSIS — R0789 Other chest pain: Secondary | ICD-10-CM | POA: Diagnosis present

## 2021-11-29 DIAGNOSIS — R739 Hyperglycemia, unspecified: Secondary | ICD-10-CM | POA: Insufficient documentation

## 2021-11-29 DIAGNOSIS — R000 Tachycardia, unspecified: Secondary | ICD-10-CM | POA: Diagnosis not present

## 2021-11-29 LAB — BASIC METABOLIC PANEL
Anion gap: 9 (ref 5–15)
BUN: 10 mg/dL (ref 6–20)
CO2: 24 mmol/L (ref 22–32)
Calcium: 9.6 mg/dL (ref 8.9–10.3)
Chloride: 104 mmol/L (ref 98–111)
Creatinine, Ser: 0.83 mg/dL (ref 0.61–1.24)
GFR, Estimated: >60 mL/min (ref >60)
Glucose, Bld: 145 mg/dL — ABNORMAL HIGH (ref 70–99)
Potassium: 3.5 mmol/L (ref 3.5–5.1)
Sodium: 137 mmol/L (ref 135–145)

## 2021-11-29 LAB — CBC
HCT: 45.3 % (ref 39.0–52.0)
Hemoglobin: 15.6 g/dL (ref 13.0–17.0)
MCH: 30.6 pg (ref 26.0–34.0)
MCHC: 34.4 g/dL (ref 30.0–36.0)
MCV: 88.8 fL (ref 80.0–100.0)
Platelets: 239 10*3/uL (ref 150–400)
RBC: 5.1 MIL/uL (ref 4.22–5.81)
RDW: 12 % (ref 11.5–15.5)
WBC: 8.9 10*3/uL (ref 4.0–10.5)
nRBC: 0 % (ref 0.0–0.2)

## 2021-11-29 LAB — D-DIMER, QUANTITATIVE: D-Dimer, Quant: <0.27 ug/mL-FEU (ref 0.00–0.50)

## 2021-11-29 LAB — TROPONIN I (HIGH SENSITIVITY)
Troponin I (High Sensitivity): 6 ng/L (ref <18)
Troponin I (High Sensitivity): 6 ng/L (ref <18)

## 2021-11-29 LAB — TSH: TSH: 0.443 u[IU]/mL (ref 0.350–4.500)

## 2021-11-29 MED ORDER — SODIUM CHLORIDE 0.9 % IV BOLUS
1000.0000 mL | Freq: Once | INTRAVENOUS | Status: AC
  Administered 2021-11-29: 1000 mL via INTRAVENOUS

## 2021-11-29 MED ORDER — ACETAMINOPHEN 500 MG PO TABS
1000.0000 mg | ORAL_TABLET | Freq: Once | ORAL | Status: AC
  Administered 2021-11-29: 1000 mg via ORAL
  Filled 2021-11-29: qty 2

## 2021-11-29 NOTE — ED Triage Notes (Signed)
Pt reports headache. Tightness in chest and trouble breathing. CP started earlier this week. CP comes and goes.

## 2021-11-29 NOTE — Discharge Instructions (Signed)
You have been evaluated for your symptoms.  Fortunately no acute concerning findings were noted on today's exam.  Call and follow-up closely with your primary care doctor for further care.  Return if you have any concern.

## 2021-11-29 NOTE — ED Provider Notes (Signed)
[facility] EMERGENCY DEPARTMENT Provider Note   CSN: [id number] Arrival date & time: 11/29/21  1320     History  Chief Complaint  Patient presents with   Chest Pain   Headache   Shortness of Breath    Shawn Moon is a 46 y.o. male.  The history is provided by the patient, medical records and the spouse. No language interpreter was used.  Chest Pain Associated symptoms: headache and shortness of breath   Headache Shortness of Breath Associated symptoms: chest pain and headaches      46 year old male significant history of prediabetes, general anxiety disorder, GERD, hyperlipidemia who presents with complaints of chest pain and headache.  Patient report earlier today he developed pain in his chest.  He described pain as a pressure sensation to his mid chest with some shortness of breath, felt sweaty lasting for several hours and has since resolved.  He also endorsed some throbbing headache accompanied with his pain which did improve.  His wife at bedside mentioned he has been complaining of intermittent chest pain in the past without any prior evaluation which concerns her.  He does not have any significant family history of premature cardiac death.  He denies any strenuous activity causing chest pain.  He does not endorse any fever or chills no runny nose sneezing coughing no nausea vomiting or diarrhea no abdominal pain or back pain.  No prior history of PE or DVT no recent surgery prolonged bedrest active cancer or hemoptysis no leg swelling or calf pain.  Patient denies alcohol or tobacco use.  He denies any strenuous activities.  He admits to increased work stress but states this is not new.  He denies any postprandial pain.  Home Medications Prior to Admission medications   Medication Sig Start Date End Date Taking? Authorizing Provider  Azelastine-Fluticasone 137-50 MCG/ACT SUSP USE 1 SPRAY IN EACH NOSTRIL TWICE DAILY. 04/17/21   Coral Spikes, DO  doxycycline (VIBRAMYCIN)  100 MG capsule Take 1 capsule (100 mg total) by mouth 2 (two) times daily. 01/12/21   Fransico Meadow, PA-C  ibuprofen (ADVIL,MOTRIN) 400 MG tablet Take 400 mg by mouth every 6 (six) hours as needed for pain.    [provider]  meclizine (ANTIVERT) 25 MG tablet Take 1 tablet (25 mg total) by mouth 3 (three) times daily as needed for dizziness. 09/05/19   Elvia Collum M, DO  Melatonin 5 MG CAPS Take 5 mg by mouth as needed.    [provider]  naproxen (NAPROSYN) 500 MG tablet prn 08/11/18   [provider]  tadalafil (CIALIS) 5 MG tablet Take 1-2 tablets (5-10 mg total) by mouth daily as needed for erectile dysfunction. Patient not taking: Reported on 01/12/2021 11/25/20   Coral Spikes, DO      Allergies    Clarithromycin and Penicillins    Review of Systems   Review of Systems  Respiratory:  Positive for shortness of breath.   Cardiovascular:  Positive for chest pain.  Neurological:  Positive for headaches.  All other systems reviewed and are negative.   Physical Exam Updated Vital Signs BP (!) 140/104 (BP Location: Right Arm)   Pulse (!) 114   Temp 98.1 F (36.7 C) (Oral)   Resp (!) 22   Ht 5\' 6"  (1.676 m)   Wt 79.4 kg   SpO2 100%   BMI 28.25 kg/m  Physical Exam Vitals and nursing note reviewed.  Constitutional:      General: He  is not in acute distress.    Appearance: He is well-developed.  HENT:     Head: Atraumatic.  Eyes:     Conjunctiva/sclera: Conjunctivae normal.  Cardiovascular:     Rate and Rhythm: Tachycardia present.     Pulses: Normal pulses.     Heart sounds: Normal heart sounds.  Pulmonary:     Effort: Pulmonary effort is normal.     Breath sounds: Normal breath sounds. No wheezing, rhonchi or rales.  Abdominal:     Palpations: Abdomen is soft.  Musculoskeletal:     Cervical back: Neck supple.     Right lower leg: No edema.     Left lower leg: No edema.  Skin:    Findings: No rash.  Neurological:     Mental Status: He  is alert and oriented to person, place, and time.  Psychiatric:        Mood and Affect: Mood normal.     ED Results / Procedures / Treatments   Labs (all labs ordered are listed, but only abnormal results are displayed) Labs Reviewed  BASIC METABOLIC PANEL - Abnormal; Notable for the following components:      Result Value   Glucose, Bld 145 (*)    All other components within normal limits  CBC  D-DIMER, QUANTITATIVE  TSH  TROPONIN I (HIGH SENSITIVITY)  TROPONIN I (HIGH SENSITIVITY)    EKG None ED ECG REPORT   Date: 11/29/2021  Rate: 111  Rhythm: sinus tachycardia  QRS Axis: normal  Intervals: normal  ST/T Wave abnormalities: normal  Conduction Disutrbances:none  Narrative Interpretation:   Old EKG Reviewed: changes noted  I have personally reviewed the EKG tracing and agree with the computerized printout as noted.   Radiology DG Chest 2 View  Result Date: 11/29/2021 CLINICAL DATA:  Tightness in chest and trouble breathing. CP started earlier this week. CP comes and goes. EXAM: CHEST - 2 VIEW COMPARISON:  05/15/2009 FINDINGS: Lungs are clear. Heart size and mediastinal contours are within normal limits. No effusion.  No pneumothorax. Visualized bones unremarkable. IMPRESSION: No acute cardiopulmonary disease. Electronically Signed   By: Corlis Leak M.D.   On: 11/29/2021 14:18    Procedures Procedures    Medications Ordered in ED Medications  acetaminophen (TYLENOL) tablet 1,000 mg (1,000 mg Oral Given 11/29/21 1449)  sodium chloride 0.9 % bolus 1,000 mL (1,000 mLs Intravenous New Bag/Given 11/29/21 1458)    ED Course/ Medical Decision Making/ A&P                           Medical Decision Making Amount and/or Complexity of Data Reviewed Labs: ordered. Radiology: ordered.  Risk OTC drugs.   BP (!) 140/104 (BP Location: Right Arm)   Pulse (!) 114   Temp 98.1 F (36.7 C) (Oral)   Resp (!) 22   Ht 5\' 6"  (1.676 m)   Wt 79.4 kg   SpO2 100%   BMI  28.25 kg/m   57:87 PM  46 year old male significant history of prediabetes, general anxiety disorder, GERD, hyperlipidemia who presents with complaints of chest pain and headache.  Patient report earlier today he developed pain in his chest.  He described pain as a pressure sensation to his mid chest with some shortness of breath, felt sweaty lasting for several hours and has since resolved.  He also endorsed some throbbing headache accompanied with his pain which did improve.  His wife at bedside mentioned he has been  complaining of intermittent chest pain in the past without any prior evaluation which concerns her.  He does not have any significant family history of premature cardiac death.  He denies any strenuous activity causing chest pain.  He does not endorse any fever or chills no runny nose sneezing coughing no nausea vomiting or diarrhea no abdominal pain or back pain.  No prior history of PE or DVT no recent surgery prolonged bedrest active cancer or hemoptysis no leg swelling or calf pain.  Patient denies alcohol or tobacco use.  He denies any strenuous activities.  He admits to increased work stress but states this is not new.  He denies any postprandial pain.  On exam this is a well-appearing male appears to be in no acute discomfort.  He is tachycardic but no murmurs rubs or gallops, lungs are clear on auscultation.  Abdomen is soft nontender.  No peripheral edema noted.  No signs of JVD.  He is resting comfortably in no acute discomfort.  No thyromegaly.  Labs, EKG, and imaging obtained independently viewed interpreted by me and I agree with radiologist interpretation.  EKG shows sinus tachycardia without any other concerning arrhythmia or ischemic changes.  Initial troponin is normal.  Patient has normal WBC, normal H&H.  Electrolyte panels are reassuring.  Mild hyperglycemia with a CBG of 145.  Chest x-ray without any acute cardiopulmonary disease.  I have added TSH and D-dimer for further  assessment.  I have ordered Tylenol for headache.  I will give IV fluid for tachycardia  4:43 PM After IV fluid, heart rate normalized.  Fortunately second troponin is negative therefore I have low suspicion for ACS causing symptoms.  Patient has normal D-dimer, low suspicion for PE.  Normal TSH, doubt thyroid cause.  At this time patient is stable to be discharged home with outpatient follow-up.  Return precaution discussed.        Final Clinical Impression(s) / ED Diagnoses Final diagnoses:  Other chest pain    Rx / DC Orders ED Discharge Orders     None         Fayrene Helper, PA-C 11/29/21 1648    Vanetta Mulders, MD 12/19/21 1254

## 2021-12-18 ENCOUNTER — Ambulatory Visit (INDEPENDENT_AMBULATORY_CARE_PROVIDER_SITE_OTHER): Payer: Commercial Managed Care - PPO | Admitting: Family Medicine

## 2021-12-18 VITALS — BP 108/68 | HR 75 | Temp 98.2°F | Ht 66.0 in | Wt 185.0 lb

## 2021-12-18 DIAGNOSIS — Z23 Encounter for immunization: Secondary | ICD-10-CM

## 2021-12-18 DIAGNOSIS — R42 Dizziness and giddiness: Secondary | ICD-10-CM

## 2021-12-18 DIAGNOSIS — Z0000 Encounter for general adult medical examination without abnormal findings: Secondary | ICD-10-CM

## 2021-12-18 MED ORDER — MECLIZINE HCL 25 MG PO TABS: 25.0000 mg | ORAL_TABLET | Freq: Three times a day (TID) | ORAL | 2 refills | Status: AC | PRN

## 2021-12-18 NOTE — Patient Instructions (Signed)
Watch diet.  Stay active.  Follow up annually.  Take care  Dr. Haileyann Staiger  

## 2021-12-21 NOTE — Assessment & Plan Note (Addendum)
Preventative health care is now up-to-date.  Flu vaccine was given today. I have reviewed his labs that he brought in.  Advised to watch his diet closely. Recommended follow-up annually.

## 2021-12-21 NOTE — Progress Notes (Signed)
Subjective:  Patient ID: KAYLEE TODOROVICH, male    DOB: [birth year]  Age: 46 y.o. MRN: [id number]  CC: Chief Complaint  Patient presents with   Annual Exam    HPI:  46 year old male presents for an annual physical exam.  Patient states that he is feeling well.  He has brought labs from his employer with him today.  Labs are notable for mildly elevated glucose of 109 as well as LDL of 106.  He states that he is staying active.  He states that he needs to work on his diet.  In regards to his preventative health care, his tetanus is up-to-date.  Colonoscopy up-to-date.  Declines additional COVID-vaccine.  Request flu vaccine today.   Patient Active Problem List   Diagnosis Date Noted   Prediabetes 11/25/2020   Vitamin D deficiency 11/25/2020   Erectile dysfunction 11/25/2020   Encounter for wellness examination in adult 09/05/2019   Generalized anxiety disorder 06/01/2017    Social Hx   Social History   Socioeconomic History   Marital status: Married    Spouse name: Not on file   Number of children: Not on file   Years of education: Not on file   Highest education level: Not on file  Occupational History   Not on file  Tobacco Use   Smoking status: Former   Smokeless tobacco: Never  Substance and Sexual Activity   Alcohol use: No   Drug use: No   Sexual activity: Not on file  Other Topics Concern   Not on file  Social History Narrative   Not on file   Social Determinants of Health   Financial Resource Strain: Not on file  Food Insecurity: Not on file  Transportation Needs: Not on file  Physical Activity: Not on file  Stress: Not on file  Social Connections: Not on file    Review of Systems  Constitutional: Negative.   Respiratory: Negative.    Cardiovascular: Negative.    Objective:  BP 108/68   Pulse 75   Temp 98.2 F (36.8 C)   Ht 5\' 6"  (1.676 m)   Wt 185 lb (83.9 kg)   SpO2 98%   BMI 29.86 kg/m      12/18/2021    9:10 AM 11/29/2021     5:00 PM 11/29/2021    4:30 PM  BP/Weight  Systolic BP 123XX123 [id number]  Diastolic BP 68 72 79  Wt. (Lbs) 185    BMI 29.86 kg/m2      Physical Exam Vitals and nursing note reviewed.  Constitutional:      General: He is not in acute distress.    Appearance: Normal appearance.  HENT:     Head: Normocephalic and atraumatic.     Nose: Nose normal.     Mouth/Throat:     Pharynx: Oropharynx is clear.  Eyes:     General:        Right eye: No discharge.        Left eye: No discharge.     Conjunctiva/sclera: Conjunctivae normal.  Cardiovascular:     Rate and Rhythm: Normal rate and regular rhythm.  Pulmonary:     Effort: Pulmonary effort is normal.     Breath sounds: Normal breath sounds. No wheezing, rhonchi or rales.  Abdominal:     General: There is no distension.     Palpations: Abdomen is soft.     Tenderness: There is no abdominal tenderness.  Neurological:     Mental Status: He  is alert.  Psychiatric:        Mood and Affect: Mood normal.        Behavior: Behavior normal.     Lab Results  Component Value Date   WBC 8.9 11/29/2021   HGB 15.6 11/29/2021   HCT 45.3 11/29/2021   PLT 239 11/29/2021   GLUCOSE 145 (H) 11/29/2021   NA 137 11/29/2021   K 3.5 11/29/2021   CL 104 11/29/2021   CREATININE 0.83 11/29/2021   BUN 10 11/29/2021   CO2 24 11/29/2021   TSH 0.443 11/29/2021     Assessment & Plan:   Problem List Items Addressed This Visit       Other   Encounter for wellness examination in adult - Primary    Preventative health care is now up-to-date.  Flu vaccine was given today. I have reviewed his labs that he brought in.  Advised to watch his diet closely. Recommended follow-up annually.      Other Visit Diagnoses     Vertigo       Relevant Medications   meclizine (ANTIVERT) 25 MG tablet   Immunization due       Relevant Orders   Flu Vaccine QUAD 7mo+IM (Fluarix, Fluzone & Alfiuria Quad PF) (Completed)       Meds ordered this encounter   Medications   meclizine (ANTIVERT) 25 MG tablet    Sig: Take 1 tablet (25 mg total) by mouth 3 (three) times daily as needed for dizziness.    Dispense:  30 tablet    Refill:  2    Follow-up: Annually  Everlene Other DO [facility]

## 2022-03-02 ENCOUNTER — Telehealth: Payer: Self-pay | Admitting: Gastroenterology

## 2022-03-02 NOTE — Telephone Encounter (Signed)
Please let patient know we will need a referral from his primary care doctor. We work by referrals from providers not patients.

## 2022-03-02 NOTE — Telephone Encounter (Signed)
Patient said it is time for him to have a colonoscopy.  He had one in 2019 and had to go to [facility] in [place] because of his insurance.  We don't have a recall on him but he had pre-cancerous polyps he said and was told to repeat in 5 years.  Can you send him a questionnaire?  He wants to have the colonoscopy done with [facility] this time.

## 2022-03-02 NOTE — Telephone Encounter (Signed)
Patient requested that we get his medical records from [facility] in [place].  They did his last colonoscopy in 2019 and he wants to have it done this next time here with [facility].

## 2022-03-05 ENCOUNTER — Telehealth: Payer: Self-pay | Admitting: Family Medicine

## 2022-03-05 DIAGNOSIS — Z1211 Encounter for screening for malignant neoplasm of colon: Secondary | ICD-10-CM

## 2022-03-05 NOTE — Telephone Encounter (Signed)
Coral Spikes, DO   Go ahead and place referral.  Thank you  Dr. Lacinda Axon

## 2022-03-05 NOTE — Telephone Encounter (Signed)
Referral ordered in EPIC. 

## 2022-03-05 NOTE — Telephone Encounter (Signed)
Patient is requesting a referral to Dr. Felipe Drone for colonoscopy. Its was discuss at his physical in November. He wants to go ahead with it now.

## 2022-03-09 ENCOUNTER — Encounter: Payer: Self-pay | Admitting: *Deleted

## 2022-03-19 NOTE — Telephone Encounter (Signed)
  Procedure: Colonoscopy  Height: 5 6 Weight: 176lbs      BMI:   Have you had a colonoscopy before?  2019, scanned in under media from 04/25/17  Do you have family history of colon cancer?  Yes, PGM  Do you have a family history of polyps? yes  Previous colonoscopy with polyps removed? Yes 2019  Do you have a history colorectal cancer?   no  Are you diabetic?  no  Do you have a prosthetic or mechanical heart valve? no  Do you have a pacemaker/defibrillator?   no  Have you had endocarditis/atrial fibrillation?  no  Do you use supplemental oxygen/CPAP?  no  Have you had joint replacement within the last 12 months?  no  Do you tend to be constipated or have to use laxatives?  no   Do you have history of alcohol use? If yes, how much and how often.  no  Do you have history or are you using drugs? If yes, what do are you  using?  no  Have you ever had a stroke/heart attack?  no  Have you ever had a heart or other vascular stent placed,?no  Do you take weight loss medication? no  Do you take any blood-thinning medications such as: (Plavix, aspirin, Coumadin, Aggrenox, Brilinta, Xarelto, Eliquis, Pradaxa, Savaysa or Effient)? no  If yes we need the name, milligram, dosage and who is prescribing doctor:               Current Outpatient Medications on File Prior to Visit  Medication Sig Dispense Refill   meclizine (ANTIVERT) 25 MG tablet Take 1 tablet (25 mg total) by mouth 3 (three) times daily as needed for dizziness. 30 tablet 2   Melatonin 10 MG TABS Take 10 mg by mouth as needed.     omeprazole (PRILOSEC) 20 MG capsule Take 20 mg by mouth daily.     oxymetazoline (AFRIN) 0.05 % nasal spray Place 2 sprays into both nostrils daily.     No current facility-administered medications on file prior to visit.    Allergies  Allergen Reactions   Clarithromycin    Penicillins

## 2022-03-25 NOTE — Telephone Encounter (Signed)
Ok to schedule. ASA 2.

## 2022-03-25 NOTE — Telephone Encounter (Signed)
LMOVM to call back to schedule 

## 2022-03-30 NOTE — Telephone Encounter (Signed)
Lmovm to call back. Letter mailed.

## 2022-04-01 NOTE — Telephone Encounter (Signed)
Pt called and left vm stating needed to schedule procedure.  Returned pt's call and he states he would like to wait until April to schedule. Advised pt once we get providers schedule for April we will give him a call.

## 2022-04-16 ENCOUNTER — Encounter: Payer: Self-pay | Admitting: *Deleted

## 2022-04-16 ENCOUNTER — Other Ambulatory Visit: Payer: Self-pay | Admitting: *Deleted

## 2022-04-16 MED ORDER — NA SULFATE-K SULFATE-MG SULF 17.5-3.13-1.6 GM/177ML PO SOLN: ORAL | 0 refills | Status: DC

## 2022-04-16 NOTE — Telephone Encounter (Signed)
Pt has been scheduled for 05/21/22 w/ Dr.Carver. Instructions given to pt and prep sent to the pharmacy

## 2022-04-19 NOTE — Telephone Encounter (Signed)
Referral completed

## 2022-05-21 ENCOUNTER — Ambulatory Visit (HOSPITAL_COMMUNITY)
Admission: RE | Admit: 2022-05-21 | Discharge: 2022-05-21 | Disposition: A | Discharge status: Home / Self Care | Payer: Commercial Managed Care - PPO | Attending: Internal Medicine | Admitting: Internal Medicine

## 2022-05-21 ENCOUNTER — Encounter (HOSPITAL_COMMUNITY): Payer: Self-pay

## 2022-05-21 ENCOUNTER — Ambulatory Visit (HOSPITAL_COMMUNITY): Payer: Commercial Managed Care - PPO | Admitting: Anesthesiology

## 2022-05-21 ENCOUNTER — Ambulatory Visit (HOSPITAL_BASED_OUTPATIENT_CLINIC_OR_DEPARTMENT_OTHER): Payer: Commercial Managed Care - PPO | Admitting: Anesthesiology

## 2022-05-21 ENCOUNTER — Encounter (HOSPITAL_COMMUNITY)
Admission: RE | Disposition: A | Discharge status: Home / Self Care | Payer: Self-pay | Source: Home / Self Care | Attending: Internal Medicine

## 2022-05-21 ENCOUNTER — Other Ambulatory Visit: Payer: Self-pay

## 2022-05-21 DIAGNOSIS — K635 Polyp of colon: Secondary | ICD-10-CM

## 2022-05-21 DIAGNOSIS — K219 Gastro-esophageal reflux disease without esophagitis: Secondary | ICD-10-CM | POA: Diagnosis not present

## 2022-05-21 DIAGNOSIS — Z87891 Personal history of nicotine dependence: Secondary | ICD-10-CM | POA: Insufficient documentation

## 2022-05-21 DIAGNOSIS — D128 Benign neoplasm of rectum: Secondary | ICD-10-CM

## 2022-05-21 DIAGNOSIS — K621 Rectal polyp: Secondary | ICD-10-CM | POA: Insufficient documentation

## 2022-05-21 DIAGNOSIS — Z1211 Encounter for screening for malignant neoplasm of colon: Secondary | ICD-10-CM | POA: Insufficient documentation

## 2022-05-21 DIAGNOSIS — Z83719 Family history of colon polyps, unspecified: Secondary | ICD-10-CM | POA: Insufficient documentation

## 2022-05-21 DIAGNOSIS — Z800 Family history of malignant neoplasm of digestive organs: Secondary | ICD-10-CM | POA: Insufficient documentation

## 2022-05-21 HISTORY — PX: COLONOSCOPY WITH PROPOFOL: SHX5780

## 2022-05-21 HISTORY — PX: POLYPECTOMY: SHX5525

## 2022-05-21 SURGERY — COLONOSCOPY WITH PROPOFOL
Anesthesia: General

## 2022-05-21 MED ORDER — PROPOFOL 500 MG/50ML IV EMUL
INTRAVENOUS | Status: DC | PRN
  Administered 2022-05-21: 150 ug/kg/min via INTRAVENOUS

## 2022-05-21 MED ORDER — LACTATED RINGERS IV SOLN
INTRAVENOUS | Status: DC
Start: 2022-05-21 — End: 2022-05-21

## 2022-05-21 MED ORDER — LACTATED RINGERS IV SOLN: INTRAVENOUS | Status: DC | PRN

## 2022-05-21 MED ORDER — PROPOFOL 10 MG/ML IV BOLUS
INTRAVENOUS | Status: DC | PRN
  Administered 2022-05-21: 10 mg via INTRAVENOUS
  Administered 2022-05-21: 20 mg via INTRAVENOUS
  Administered 2022-05-21: 100 mg via INTRAVENOUS

## 2022-05-21 MED ORDER — LIDOCAINE HCL 1 % IJ SOLN
INTRAMUSCULAR | Status: DC | PRN
  Administered 2022-05-21: 50 mg via INTRADERMAL

## 2022-05-21 NOTE — Transfer of Care (Signed)
Immediate Anesthesia Transfer of Care Note  Patient: Shawn Moon  Procedure(s) Performed: COLONOSCOPY WITH PROPOFOL POLYPECTOMY  Patient Location: Endoscopy Unit  Anesthesia Type:General  Level of Consciousness: awake  Airway & Oxygen Therapy: Patient Spontanous Breathing  Post-op Assessment: Report given to RN  Post vital signs: Reviewed and stable  Last Vitals:  Vitals Value Taken Time  BP    Temp    Pulse    Resp    SpO2      Last Pain:  Vitals:   05/21/22 0733  TempSrc:   PainSc: 0-No pain      Patients Stated Pain Goal: 8 (05/21/22 1660)  Complications: No notable events documented.

## 2022-05-21 NOTE — Anesthesia Postprocedure Evaluation (Signed)
Anesthesia Post Note  Patient: Shawn Moon  Procedure(s) Performed: COLONOSCOPY WITH PROPOFOL POLYPECTOMY  Patient location during evaluation: Endoscopy Anesthesia Type: General Level of consciousness: awake and alert Pain management: pain level controlled Vital Signs Assessment: post-procedure vital signs reviewed and stable Respiratory status: spontaneous breathing Cardiovascular status: blood pressure returned to baseline and stable Postop Assessment: no apparent nausea or vomiting Anesthetic complications: no   No notable events documented.   Last Vitals:  Vitals:   05/21/22 0643  BP: 121/77  Pulse: 75  Resp: 16  Temp: 36.5 C  SpO2: 97%    Last Pain:  Vitals:   05/21/22 0733  TempSrc:   PainSc: 0-No pain                 Sharifah Champine

## 2022-05-21 NOTE — H&P (Signed)
Primary Care Physician:  Tommie Sams, DO Primary Gastroenterologist:  Dr. Marletta Lor  Pre-Procedure History & Physical: HPI:  NYEEM CORNETT is a 47 y.o. male is here for a colonoscopy to be performed for high risk colon cancer screening purposes. Family history of colon cancer and polyps.   Past Medical History:  Diagnosis Date   GERD (gastroesophageal reflux disease)    Hyperlipidemia     Past Surgical History:  Procedure Laterality Date   COLONOSCOPY  04/25/2017   ESOPHAGOGASTRODUODENOSCOPY  12/09/2009   Normal esophagus,status post softer biopsy; Couple of tiny antral erosions of doubtful clinical significance otherwise normal stomach    Prior to Admission medications   Medication Sig Start Date End Date Taking? Authorizing Provider  ibuprofen (ADVIL) 200 MG tablet Take 400-600 mg by mouth every 6 (six) hours as needed for mild pain, moderate pain or headache.   Yes [provider]  Melatonin 10 MG TABS Take 10 mg by mouth at bedtime as needed (sleep).   Yes [provider]  Na Sulfate-K Sulfate-Mg Sulf 17.5-3.13-1.6 GM/177ML SOLN As directed 04/16/22   Lanelle Bal, DO  omeprazole (PRILOSEC) 20 MG capsule Take 20 mg by mouth daily.   Yes [provider]  oxymetazoline (AFRIN) 0.05 % nasal spray Place 2 sprays into both nostrils daily as needed for congestion.   Yes [provider]  meclizine (ANTIVERT) 25 MG tablet Take 1 tablet (25 mg total) by mouth 3 (three) times daily as needed for dizziness. 12/18/21   Tommie Sams, DO    Allergies as of 04/16/2022 - Review Complete 12/18/2021  Allergen Reaction Noted   Clarithromycin     Penicillins      Family History  Problem Relation Age of Onset   Colon cancer Paternal Grandmother    Diabetes Paternal Grandmother    Hypertension Father    Diabetes Maternal Grandmother     Social History   Socioeconomic History   Marital status: Married    Spouse name: Not on file   Number of  children: Not on file   Years of education: Not on file   Highest education level: Not on file  Occupational History   Not on file  Tobacco Use   Smoking status: Former   Smokeless tobacco: Never  Vaping Use   Vaping Use: Never used  Substance and Sexual Activity   Alcohol use: No   Drug use: No   Sexual activity: Not on file  Other Topics Concern   Not on file  Social History Narrative   Not on file   Social Determinants of Health   Financial Resource Strain: Not on file  Food Insecurity: Not on file  Transportation Needs: Not on file  Physical Activity: Not on file  Stress: Not on file  Social Connections: Not on file  Intimate Partner Violence: Not on file    Review of Systems: See HPI, otherwise negative ROS  Physical Exam: Vital signs in last 24 hours: Temp:  [97.7 F (36.5 C)] 97.7 F (36.5 C) (04/12 0643) Pulse Rate:  [75] 75 (04/12 0643) Resp:  [16] 16 (04/12 0643) BP: (121)/(77) 121/77 (04/12 0643) SpO2:  [97 %] 97 % (04/12 0643) Weight:  [79.4 kg] 79.4 kg (04/12 0643)   General:   Alert,  Well-developed, well-nourished, pleasant and cooperative in NAD Head:  Normocephalic and atraumatic. Eyes:  Sclera clear, no icterus.   Conjunctiva pink. Ears:  Normal auditory acuity. Nose:  No deformity, discharge,  or lesions.  Msk:  Symmetrical without gross deformities. Normal posture. Extremities:  Without clubbing or edema. Neurologic:  Alert and  oriented x4;  grossly normal neurologically. Skin:  Intact without significant lesions or rashes. Psych:  Alert and cooperative. Normal mood and affect.  Impression/Plan: KAIROS PANETTA is here for a colonoscopy to be performed for high risk colon cancer screening purposes. Family history of colon cancer and polyps.   The risks of the procedure including infection, bleed, or perforation as well as benefits, limitations, alternatives and imponderables have been reviewed with the patient. Questions have been  answered. All parties agreeable.

## 2022-05-21 NOTE — Op Note (Signed)
[facility] Patient Name: Shawn Moon Procedure Date: 05/21/2022 7:19 AM MRN: [id number] Date of Birth: September 14, 1975 Attending MD: Hennie Duos. Maple Mirza, [id number] CSN: [id number] Age: 47 Admit Type: Outpatient Procedure:                Colonoscopy Indications:              Screening for colon cancer: Family history of                            colorectal cancer in distant relative(s), Colon                            cancer screening in patient at increased risk:                            Family history of 1st-degree relative with colon                            polyps Providers:                Hennie Duos. Marletta Lor, DO, Jannett Celestine, RN, Pandora Leiter, Technician Referring MD:              Medicines:                See the Anesthesia note for documentation of the                            administered medications Complications:            No immediate complications. Estimated Blood Loss:     Estimated blood loss was minimal. Procedure:                Pre-Anesthesia Assessment:                           - The anesthesia plan was to use monitored                            anesthesia care (MAC).                           After obtaining informed consent, the colonoscope                            was passed under direct vision. Throughout the                            procedure, the patient's blood pressure, pulse, and                            oxygen saturations were monitored continuously. The                            [id number]-HQ190L ([id number]) scope was introduced through  the anus and advanced to the the cecum, identified                            by appendiceal orifice and ileocecal valve. The                            colonoscopy was performed without difficulty. The                            patient tolerated the procedure well. The quality                            of the bowel preparation was evaluated using the                             BBPS [facility] Bowel Preparation Scale) with scores                            of: Right Colon = 2 (minor amount of residual                            staining, small fragments of stool and/or opaque                            liquid, but mucosa seen well), Transverse Colon = 3                            (entire mucosa seen well with no residual staining,                            small fragments of stool or opaque liquid) and Left                            Colon = 3 (entire mucosa seen well with no residual                            staining, small fragments of stool or opaque                            liquid). The total BBPS score equals 8. The quality                            of the bowel preparation was good. Scope In: 7:34:49 AM Scope Out: 7:45:56 AM Scope Withdrawal Time: 0 hours 9 minutes 41 seconds  Total Procedure Duration: 0 hours 11 minutes 7 seconds  Findings:      Three sessile polyps were found in the rectum. The polyps were 4 to 5 mm       in size. These polyps were removed with a cold snare. Resection and       retrieval were complete.      The exam was otherwise without abnormality. Impression:               -  Three 4 to 5 mm polyps in the rectum, removed                            with a cold snare. Resected and retrieved.                           - The examination was otherwise normal. Moderate Sedation:      Per Anesthesia Care Recommendation:           - Patient has a contact number available for                            emergencies. The signs and symptoms of potential                            delayed complications were discussed with the                            patient. Return to normal activities tomorrow.                            Written discharge instructions were provided to the                            patient.                           - Resume previous diet.                           - Continue present  medications.                           - Await pathology results.                           - Repeat colonoscopy in 5 years for surveillance                            and family history of polyps/cancer                           - Return to GI clinic PRN. Procedure Code(s):        --- Professional ---                           [phone], Colonoscopy, flexible; with removal of                            tumor(s), polyp(s), or other lesion(s) by snare                            technique Diagnosis Code(s):        --- Professional ---  Z12.11, Encounter for screening for malignant                            neoplasm of colon                           Z80.0, Family history of malignant neoplasm of                            digestive organs                           Z83.71, Family history of colonic polyps                           D12.8, Benign neoplasm of rectum CPT copyright 2022 American [employer] Association. All rights reserved. The codes documented in this report are preliminary and upon coder review may  be revised to meet current compliance requirements. Hennie Duos. Marletta Lor, DO Hennie Duos. Marletta Lor, DO 05/21/2022 7:49:27 AM This report has been signed electronically. Number of Addenda: 0

## 2022-05-21 NOTE — Anesthesia Preprocedure Evaluation (Signed)
Anesthesia Evaluation  Patient identified by MRN, date of birth, ID band Patient awake    Reviewed: Allergy & Precautions, H&P , NPO status , Patient's Chart, lab work & pertinent test results, reviewed documented beta blocker date and time   Airway Mallampati: II  TM Distance: >3 FB Neck ROM: full    Dental no notable dental hx.    Pulmonary neg pulmonary ROS, former smoker   Pulmonary exam normal breath sounds clear to auscultation       Cardiovascular Exercise Tolerance: Good negative cardio ROS  Rhythm:regular Rate:Normal     Neuro/Psych  PSYCHIATRIC DISORDERS Anxiety     negative neurological ROS  negative psych ROS   GI/Hepatic negative GI ROS, Neg liver ROS,GERD  ,,  Endo/Other  negative endocrine ROS    Renal/GU negative Renal ROS  negative genitourinary   Musculoskeletal   Abdominal   Peds  Hematology negative hematology ROS (+)   Anesthesia Other Findings   Reproductive/Obstetrics negative OB ROS                             Anesthesia Physical Anesthesia Plan  ASA: 2  Anesthesia Plan: General   Post-op Pain Management:    Induction:   PONV Risk Score and Plan: Propofol infusion  Airway Management Planned:   Additional Equipment:   Intra-op Plan:   Post-operative Plan:   Informed Consent: I have reviewed the patients History and Physical, chart, labs and discussed the procedure including the risks, benefits and alternatives for the proposed anesthesia with the patient or authorized representative who has indicated his/her understanding and acceptance.     Dental Advisory Given  Plan Discussed with: CRNA  Anesthesia Plan Comments:        Anesthesia Quick Evaluation

## 2022-05-21 NOTE — Discharge Instructions (Addendum)
°  Colonoscopy °Discharge Instructions ° °Read the instructions outlined below and refer to this sheet in the next few weeks. These discharge instructions provide you with general information on caring for yourself after you leave the hospital. Your doctor may also give you specific instructions. While your treatment has been planned according to the most current medical practices available, unavoidable complications occasionally occur.  ° °ACTIVITY °You may resume your regular activity, but move at a slower pace for the next 24 hours.  °Take frequent rest periods for the next 24 hours.  °Walking will help get rid of the air and reduce the bloated feeling in your belly (abdomen).  °No driving for 24 hours (because of the medicine (anesthesia) used during the test).   °Do not sign any important legal documents or operate any machinery for 24 hours (because of the anesthesia used during the test).  °NUTRITION °Drink plenty of fluids.  °You may resume your normal diet as instructed by your doctor.  °Begin with a light meal and progress to your normal diet. Heavy or fried foods are harder to digest and may make you feel sick to your stomach (nauseated).  °Avoid alcoholic beverages for 24 hours or as instructed.  °MEDICATIONS °You may resume your normal medications unless your doctor tells you otherwise.  °WHAT YOU CAN EXPECT TODAY °Some feelings of bloating in the abdomen.  °Passage of more gas than usual.  °Spotting of blood in your stool or on the toilet paper.  °IF YOU HAD POLYPS REMOVED DURING THE COLONOSCOPY: °No aspirin products for 7 days or as instructed.  °No alcohol for 7 days or as instructed.  °Eat a soft diet for the next 24 hours.  °FINDING OUT THE RESULTS OF YOUR TEST °Not all test results are available during your visit. If your test results are not back during the visit, make an appointment with your caregiver to find out the results. Do not assume everything is normal if you have not heard from your  caregiver or the medical facility. It is important for you to follow up on all of your test results.  °SEEK IMMEDIATE MEDICAL ATTENTION IF: °You have more than a spotting of blood in your stool.  °Your belly is swollen (abdominal distention).  °You are nauseated or vomiting.  °You have a temperature over 101.  °You have abdominal pain or discomfort that is severe or gets worse throughout the day.  ° °Your colonoscopy revealed 3 polyp(s) which I removed successfully. Await pathology results, my office will contact you. I recommend repeating colonoscopy in 5 years for surveillance purposes. Otherwise follow up with GI as needed.  ° ° °I hope you have a great rest of your week! ° °Charles K. Carver, D.O. °Gastroenterology and Hepatology °[facility] ° °

## 2022-05-24 LAB — SURGICAL PATHOLOGY

## 2022-05-27 ENCOUNTER — Encounter (HOSPITAL_COMMUNITY): Payer: Self-pay | Admitting: Internal Medicine

## 2022-12-20 ENCOUNTER — Encounter: Payer: Commercial Managed Care - PPO | Admitting: Family Medicine

## 2023-01-17 ENCOUNTER — Ambulatory Visit: Payer: Commercial Managed Care - PPO | Admitting: Family Medicine

## 2023-01-17 VITALS — BP 106/69 | HR 71 | Temp 97.8°F | Ht 66.14 in | Wt 191.8 lb

## 2023-01-17 DIAGNOSIS — K219 Gastro-esophageal reflux disease without esophagitis: Secondary | ICD-10-CM | POA: Insufficient documentation

## 2023-01-17 DIAGNOSIS — Z23 Encounter for immunization: Secondary | ICD-10-CM

## 2023-01-17 DIAGNOSIS — Z0000 Encounter for general adult medical examination without abnormal findings: Secondary | ICD-10-CM | POA: Insufficient documentation

## 2023-01-17 NOTE — Patient Instructions (Signed)
Follow up annually. ? ?Take care ? ?Dr. Kivon Aprea  ?

## 2023-01-17 NOTE — Progress Notes (Signed)
Subjective:  Patient ID: Shawn Moon, male    DOB: [birth year]  Age: 47 y.o. MRN: [id number]  CC: Annual physical exam   HPI:  47 year old male presents for an annual physical exam.  Patient desires his flu vaccine today.  Colonoscopy up-to-date.  Has had labs earlier this year through his workplace.  He brought these in for review today.  Mild hyperlipidemia.  Elevated glucose.  Labs otherwise unremarkable.  There was no CBC or metabolic panel with his labs.  He is feeling well.  No chest pain or shortness of breath.  He reports some recent weight gain.  Patient Active Problem List   Diagnosis Date Noted   GERD (gastroesophageal reflux disease) 01/17/2023   Annual physical exam 01/17/2023   Prediabetes 11/25/2020    Social Hx   Social History   Socioeconomic History   Marital status: Married    Spouse name: Not on file   Number of children: Not on file   Years of education: Not on file   Highest education level: Not on file  Occupational History   Not on file  Tobacco Use   Smoking status: Former   Smokeless tobacco: Never  Vaping Use   Vaping status: Never Used  Substance and Sexual Activity   Alcohol use: No   Drug use: No   Sexual activity: Not on file  Other Topics Concern   Not on file  Social History Narrative   Not on file   Social Determinants of Health   Financial Resource Strain: Not on file  Food Insecurity: Not on file  Transportation Needs: Not on file  Physical Activity: Not on file  Stress: Not on file  Social Connections: Not on file    Review of Systems Per HPI  Objective:  BP 106/69   Pulse 71   Temp 97.8 F (36.6 C)   Ht 5' 6.14" (1.68 m)   Wt 191 lb 12.8 oz (87 kg)   SpO2 97%   BMI 30.82 kg/m      01/17/2023    9:05 AM 05/21/2022    7:50 AM 05/21/2022    6:43 AM  BP/Weight  Systolic BP 106 96 121  Diastolic BP 69 66 77  Wt. (Lbs) 191.8  175  BMI 30.82 kg/m2  28.25 kg/m2    Physical Exam Vitals and nursing note  reviewed.  Constitutional:      General: He is not in acute distress.    Appearance: Normal appearance.  HENT:     Head: Normocephalic and atraumatic.  Eyes:     General:        Right eye: No discharge.        Left eye: No discharge.     Conjunctiva/sclera: Conjunctivae normal.  Cardiovascular:     Rate and Rhythm: Normal rate and regular rhythm.  Pulmonary:     Effort: Pulmonary effort is normal.     Breath sounds: Normal breath sounds. No wheezing, rhonchi or rales.  Neurological:     Mental Status: He is alert.  Psychiatric:        Mood and Affect: Mood normal.        Behavior: Behavior normal.     Lab Results  Component Value Date   WBC 8.9 11/29/2021   HGB 15.6 11/29/2021   HCT 45.3 11/29/2021   PLT 239 11/29/2021   GLUCOSE 145 (H) 11/29/2021   NA 137 11/29/2021   K 3.5 11/29/2021   CL 104 11/29/2021   CREATININE  0.83 11/29/2021   BUN 10 11/29/2021   CO2 24 11/29/2021   TSH 0.443 11/29/2021     Assessment & Plan:   Problem List Items Addressed This Visit       Other   Annual physical exam - Primary    Doing well.  Healthcare maintenance section updated today.  Flu vaccine given today.  Rx given so that labs can be obtained at his workplace as he gets these for free.      Other Visit Diagnoses     Immunization due       Relevant Orders   Flu vaccine trivalent PF, 6mos and older(Flulaval,Afluria,Fluarix,Fluzone) (Completed)       Follow-up:  Annually  Everlene Other DO [facility]

## 2023-01-17 NOTE — Assessment & Plan Note (Signed)
Doing well.  Healthcare maintenance section updated today.  Flu vaccine given today.  Rx given so that labs can be obtained at his workplace as he gets these for free.

## 2023-03-13 ENCOUNTER — Other Ambulatory Visit: Payer: Self-pay | Admitting: Medical Genetics

## 2023-03-21 ENCOUNTER — Other Ambulatory Visit (HOSPITAL_COMMUNITY): Payer: Self-pay

## 2023-03-28 ENCOUNTER — Other Ambulatory Visit (HOSPITAL_COMMUNITY): Payer: Self-pay

## 2023-04-04 ENCOUNTER — Other Ambulatory Visit (HOSPITAL_COMMUNITY): Payer: Self-pay

## 2023-04-08 ENCOUNTER — Other Ambulatory Visit (HOSPITAL_COMMUNITY): Payer: Self-pay

## 2023-04-25 ENCOUNTER — Other Ambulatory Visit (HOSPITAL_COMMUNITY)
Admission: RE | Admit: 2023-04-25 | Discharge: 2023-04-25 | Disposition: A | Discharge status: Home / Self Care | Payer: Self-pay | Source: Ambulatory Visit | Attending: Medical Genetics | Admitting: Medical Genetics

## 2023-05-03 LAB — GENECONNECT MOLECULAR SCREEN: Genetic Analysis Overall Interpretation: NEGATIVE

## 2023-12-10 ENCOUNTER — Other Ambulatory Visit: Payer: Self-pay

## 2023-12-10 ENCOUNTER — Emergency Department (HOSPITAL_COMMUNITY)

## 2023-12-10 ENCOUNTER — Emergency Department (HOSPITAL_COMMUNITY)
Admission: EM | Admit: 2023-12-10 | Discharge: 2023-12-10 | Disposition: A | Discharge status: Home / Self Care | Attending: Emergency Medicine | Admitting: Emergency Medicine

## 2023-12-10 ENCOUNTER — Encounter (HOSPITAL_COMMUNITY): Payer: Self-pay | Admitting: Emergency Medicine

## 2023-12-10 DIAGNOSIS — W208XXA Other cause of strike by thrown, projected or falling object, initial encounter: Secondary | ICD-10-CM | POA: Diagnosis not present

## 2023-12-10 DIAGNOSIS — S9031XA Contusion of right foot, initial encounter: Secondary | ICD-10-CM | POA: Diagnosis not present

## 2023-12-10 DIAGNOSIS — M79671 Pain in right foot: Secondary | ICD-10-CM | POA: Diagnosis present

## 2023-12-10 MED ORDER — NAPROXEN 500 MG PO TABS
500.0000 mg | ORAL_TABLET | Freq: Two times a day (BID) | ORAL | 0 refills | Status: AC
Start: 2023-12-10 — End: ?

## 2023-12-10 MED ORDER — ACETAMINOPHEN 500 MG PO TABS
1000.0000 mg | ORAL_TABLET | Freq: Once | ORAL | Status: AC
  Administered 2023-12-10: 1000 mg via ORAL
  Filled 2023-12-10: qty 2

## 2023-12-10 NOTE — Discharge Instructions (Signed)
 Please follow-up closely with primary care doctor on an outpatient basis.  Return to emergency department immediately for any new or worsening symptoms.

## 2023-12-10 NOTE — ED Notes (Signed)
 X-ray at bedside.

## 2023-12-10 NOTE — ED Provider Notes (Signed)
  EMERGENCY DEPARTMENT AT [facility] Provider Note   CSN: [id number] Arrival date & time: 12/10/23  1734     Patient presents with: No chief complaint on file.   Shawn Moon is a 48 y.o. male.   Patient is a 48 year old male who presents emergency department the chief complaint of pain to the dorsal aspect of the right foot.  He notes that he dropped a piece of furniture on his foot just prior to arrival.  He notes he has a burning sensation to the dorsal aspect of his foot as well as burning pain to the second toe.  He denies any previous injuries or surgeries to the affected extremity.  He denies any other long bone or joint pain.        Prior to Admission medications   Medication Sig Start Date End Date Taking? Authorizing Provider  naproxen (NAPROSYN) 500 MG tablet Take 1 tablet (500 mg total) by mouth 2 (two) times daily. 12/10/23  Yes Daralene Bruckner D, PA-C  ibuprofen (ADVIL) 200 MG tablet Take 400-600 mg by mouth every 6 (six) hours as needed for mild pain, moderate pain or headache.    [provider]  meclizine  (ANTIVERT ) 25 MG tablet Take 1 tablet (25 mg total) by mouth 3 (three) times daily as needed for dizziness. 12/18/21   Cook, Jayce G, DO  Melatonin 10 MG TABS Take 10 mg by mouth at bedtime as needed (sleep).    [provider]  omeprazole (PRILOSEC) 20 MG capsule Take 20 mg by mouth daily.    [provider]  oxymetazoline (AFRIN) 0.05 % nasal spray Place 2 sprays into both nostrils daily as needed for congestion.    [provider]    Allergies: Clarithromycin and Penicillins    Review of Systems  Musculoskeletal:        Pain to the right foot  All other systems reviewed and are negative.   Updated Vital Signs BP 117/80   Pulse 99   Temp 98.2 F (36.8 C) (Oral)   Resp 20   Ht 5' 6 (1.676 m)   Wt 79.4 kg   SpO2 100%   BMI 28.25 kg/m   Physical Exam Vitals and nursing note reviewed.   Constitutional:      General: He is not in acute distress.    Appearance: Normal appearance. He is not ill-appearing.  HENT:     Head: Normocephalic and atraumatic.  Eyes:     Extraocular Movements: Extraocular movements intact.     Conjunctiva/sclera: Conjunctivae normal.     Pupils: Pupils are equal, round, and reactive to light.  Cardiovascular:     Rate and Rhythm: Normal rate and regular rhythm.     Pulses: Normal pulses.  Pulmonary:     Effort: Pulmonary effort is normal. No respiratory distress.  Musculoskeletal:        General: Normal range of motion.     Comments: Tender to palpation over the dorsal right midfoot, nontender palpation over the distal digits, ankle, knee, hip, DP and PT pulses are 2+ in the right lower extremity, sensation intact distally, full range of motion noted throughout, no obvious deformity, no skin breakdown or ulceration, no lacerations or abrasions  Skin:    General: Skin is warm and dry.  Neurological:     General: No focal deficit present.     Mental Status: He is alert and oriented to person, place, and time. Mental status is at baseline.     (  all labs ordered are listed, but only abnormal results are displayed) Labs Reviewed - No data to display  EKG: None  Radiology: DG Foot Complete Right Result Date: 12/10/2023 CLINICAL DATA:  Status post trauma. EXAM: RIGHT FOOT COMPLETE - 3+ VIEW COMPARISON:  None Available. FINDINGS: There is no evidence of fracture or dislocation. There is no evidence of arthropathy or other focal bone abnormality. Soft tissues are unremarkable. IMPRESSION: Negative. Electronically Signed   By: Suzen Dials M.D.   On: 12/10/2023 18:55     Procedures   Medications Ordered in the ED  acetaminophen  (TYLENOL ) tablet 1,000 mg (1,000 mg Oral Given 12/10/23 1821)                                    Medical Decision Making Patient is doing well at this time and is stable for discharge home.  Discussed with  patient that x-rays demonstrated no signs of acute osseous injury or lesions.  Suspect contusion at this time.  Will provide cam boot for increased comfort with walking.  Patient has no concern neurological deficits and is neurovascularly intact distally.  He had no other long bone or joint pain noted on exam.  Will provide NSAIDs for pain.  Close follow-up with primary care doctor was discussed as well as strict turn precautions for any new or worsening symptoms.  Patient voiced understanding and had no additional questions.  Amount and/or Complexity of Data Reviewed Radiology: ordered.  Risk OTC drugs. Prescription drug management.        Final diagnoses:  Contusion of right foot, initial encounter    ED Discharge Orders          Ordered    naproxen (NAPROSYN) 500 MG tablet  2 times daily        12/10/23 1925               Tanay Massiah D, PA-C 12/10/23 RANDALL Suzette Pac, MD 12/11/23 1016

## 2023-12-10 NOTE — ED Triage Notes (Addendum)
 Pt to ed pov c/o of right foot pain d/t moving furniture and an ottoman landed on the top of right foot. Pt endorses bruising and swelling to medial top of foot and 2nd toe. Pt able to ambulate Pt took 600 mg advil at 1530

## 2023-12-29 ENCOUNTER — Ambulatory Visit

## 2023-12-29 ENCOUNTER — Ambulatory Visit
Admission: RE | Admit: 2023-12-29 | Discharge: 2023-12-29 | Disposition: A | Discharge status: Home / Self Care | Attending: Family Medicine | Admitting: Family Medicine

## 2023-12-29 VITALS — BP 118/79 | HR 81 | Temp 98.0°F | Resp 16

## 2023-12-29 DIAGNOSIS — J069 Acute upper respiratory infection, unspecified: Secondary | ICD-10-CM

## 2023-12-29 MED ORDER — PROMETHAZINE-DM 6.25-15 MG/5ML PO SYRP: 5.0000 mL | ORAL_SOLUTION | Freq: Four times a day (QID) | ORAL | 0 refills | Status: AC | PRN

## 2023-12-29 MED ORDER — AZELASTINE HCL 0.1 % NA SOLN: 1.0000 | Freq: Two times a day (BID) | NASAL | 0 refills | Status: AC

## 2023-12-29 NOTE — ED Triage Notes (Signed)
 Pt states cough and congestion,sore throat,body aches for the past 5 days.  States he has been taking mucinex  at home.  States he had a negative home covid test.

## 2023-12-29 NOTE — Discharge Instructions (Addendum)
 In addition to the prescribed medications, you may continue taking DayQuil, NyQuil, plain Mucinex , using saline sinus rinses and humidifiers.  Follow-up for worsening or unresolving symptoms.

## 2023-12-29 NOTE — ED Provider Notes (Signed)
 [facility]    CSN: [id number] Arrival date & time: 12/29/23  1338      History   Chief Complaint Chief Complaint  Patient presents with   Cough    Sore throat, sinus congestion and coughing - Entered by patient    HPI Shawn Moon is a 48 y.o. male.   Patient presenting today with 5-day history of congestion, cough, sore throat, body aches, fatigue.  Denies fever, chills, chest pain, shortness of breath, abdominal pain, vomiting, diarrhea.  So far try Mucinex  with mild temporary benefit.  No known history of chronic pulmonary disease.    Past Medical History:  Diagnosis Date   GERD (gastroesophageal reflux disease)    Hyperlipidemia     Patient Active Problem List   Diagnosis Date Noted   GERD (gastroesophageal reflux disease) 01/17/2023   Annual physical exam 01/17/2023   Prediabetes 11/25/2020    Past Surgical History:  Procedure Laterality Date   COLONOSCOPY  04/25/2017   COLONOSCOPY WITH PROPOFOL  N/A 05/21/2022   Procedure: COLONOSCOPY WITH PROPOFOL ;  Surgeon: Cindie Carlin POUR, DO;  Location: [facility];  Service: Endoscopy;  Laterality: N/A;  7:30 am, asa 2   ESOPHAGOGASTRODUODENOSCOPY  12/09/2009   Normal esophagus,status post softer biopsy; Couple of tiny antral erosions of doubtful clinical significance otherwise normal stomach   POLYPECTOMY  05/21/2022   Procedure: POLYPECTOMY;  Surgeon: Cindie Carlin POUR, DO;  Location: [facility];  Service: Endoscopy;;       Home Medications    Prior to Admission medications   Medication Sig Start Date End Date Taking? Authorizing Provider  azelastine  (ASTELIN ) 0.1 % nasal spray Place 1 spray into both nostrils 2 (two) times daily. Use in each nostril as directed 12/29/23  Yes Stuart Vernell Norris, PA-C  promethazine-dextromethorphan (PROMETHAZINE-DM) 6.25-15 MG/5ML syrup Take 5 mLs by mouth 4 (four) times daily as needed. 12/29/23  Yes Stuart Vernell Norris, PA-C  ibuprofen (ADVIL) 200 MG  tablet Take 400-600 mg by mouth every 6 (six) hours as needed for mild pain, moderate pain or headache.    [provider]  meclizine  (ANTIVERT ) 25 MG tablet Take 1 tablet (25 mg total) by mouth 3 (three) times daily as needed for dizziness. 12/18/21   Cook, Jayce G, DO  Melatonin 10 MG TABS Take 10 mg by mouth at bedtime as needed (sleep).    [provider]  naproxen (NAPROSYN) 500 MG tablet Take 1 tablet (500 mg total) by mouth 2 (two) times daily. 12/10/23   Daralene Lonni BIRCH, PA-C  omeprazole (PRILOSEC) 20 MG capsule Take 20 mg by mouth daily.    [provider]  oxymetazoline (AFRIN) 0.05 % nasal spray Place 2 sprays into both nostrils daily as needed for congestion.    [provider]    Family History Family History  Problem Relation Age of Onset   Colon cancer Paternal Grandmother    Diabetes Paternal Grandmother    Hypertension Father    Diabetes Maternal Grandmother     Social History Social History   Tobacco Use   Smoking status: Former   Smokeless tobacco: Never  Vaping Use   Vaping status: Never Used  Substance Use Topics   Alcohol use: No   Drug use: No     Allergies   Clarithromycin and Penicillins   Review of Systems Review of Systems PER HPI  Physical Exam Triage Vital Signs ED Triage Vitals  Encounter Vitals Group     BP 12/29/23 1353  118/79     Girls Systolic BP Percentile --      Girls Diastolic BP Percentile --      Boys Systolic BP Percentile --      Boys Diastolic BP Percentile --      Pulse Rate 12/29/23 1353 81     Resp 12/29/23 1353 16     Temp 12/29/23 1353 98 F (36.7 C)     Temp Source 12/29/23 1353 Oral     SpO2 12/29/23 1353 98 %     Weight --      Height --      Head Circumference --      Peak Flow --      Pain Score 12/29/23 1352 0     Pain Loc --      Pain Education --      Exclude from Growth Chart --    No data found.  Updated Vital Signs BP 118/79 (BP Location: Right Arm)    Pulse 81   Temp 98 F (36.7 C) (Oral)   Resp 16   SpO2 98%   Visual Acuity Right Eye Distance:   Left Eye Distance:   Bilateral Distance:    Right Eye Near:   Left Eye Near:    Bilateral Near:     Physical Exam Vitals and nursing note reviewed.  Constitutional:      Appearance: He is well-developed.  HENT:     Head: Atraumatic.     Right Ear: External ear normal.     Left Ear: External ear normal.     Nose: Rhinorrhea present.     Mouth/Throat:     Pharynx: Posterior oropharyngeal erythema present. No oropharyngeal exudate.  Eyes:     Conjunctiva/sclera: Conjunctivae normal.     Pupils: Pupils are equal, round, and reactive to light.  Cardiovascular:     Rate and Rhythm: Normal rate and regular rhythm.  Pulmonary:     Effort: Pulmonary effort is normal. No respiratory distress.     Breath sounds: No wheezing or rales.  Musculoskeletal:        General: Normal range of motion.     Cervical back: Normal range of motion and neck supple.  Lymphadenopathy:     Cervical: No cervical adenopathy.  Skin:    General: Skin is warm and dry.  Neurological:     Mental Status: He is alert and oriented to person, place, and time.  Psychiatric:        Behavior: Behavior normal.    UC Treatments / Results  Labs (all labs ordered are listed, but only abnormal results are displayed) Labs Reviewed - No data to display  EKG   Radiology No results found.  Procedures Procedures (including critical care time)  Medications Ordered in UC Medications - No data to display  Initial Impression / Assessment and Plan / UC Course  I have reviewed the triage vital signs and the nursing notes.  Pertinent labs & imaging results that were available during my care of the patient were reviewed by me and considered in my medical decision making (see chart for details).     Vitals and exam reassuring today, consistent with viral respiratory infection.  Will treat with Astelin , Phenergan  DM, supportive over-the-counter medications and home care.  Return for worsening or unresolving symptoms.  Final Clinical Impressions(s) / UC Diagnoses   Final diagnoses:  Viral URI with cough     Discharge Instructions      In addition to  the prescribed medications, you may continue taking DayQuil, NyQuil, plain Mucinex , using saline sinus rinses and humidifiers.  Follow-up for worsening or unresolving symptoms.    ED Prescriptions     Medication Sig Dispense Auth. Provider   azelastine  (ASTELIN ) 0.1 % nasal spray Place 1 spray into both nostrils 2 (two) times daily. Use in each nostril as directed 30 mL Stuart Vernell Norris, PA-C   promethazine-dextromethorphan (PROMETHAZINE-DM) 6.25-15 MG/5ML syrup Take 5 mLs by mouth 4 (four) times daily as needed. 100 mL Stuart Vernell Norris, [place]      PDMP not reviewed this encounter.   Stuart Vernell Norris, [place] 12/29/23 1650

## 2024-03-19 ENCOUNTER — Encounter: Admitting: Family Medicine
